# Patient Record
Sex: Female | Born: 1965 | Race: White | Hispanic: No | Marital: Married
Health system: Midwestern US, Community
[De-identification: ages and names within clinical notes are randomized; demographics above are authoritative.]

## PROBLEM LIST (undated history)

## (undated) DIAGNOSIS — Z1231 Encounter for screening mammogram for malignant neoplasm of breast: Principal | ICD-10-CM

## (undated) DIAGNOSIS — M51369 Other intervertebral disc degeneration, lumbar region without mention of lumbar back pain or lower extremity pain: Secondary | ICD-10-CM

## (undated) DIAGNOSIS — M5459 Other low back pain: Principal | ICD-10-CM

## (undated) DIAGNOSIS — M5136 Other intervertebral disc degeneration, lumbar region: Secondary | ICD-10-CM

## (undated) DIAGNOSIS — M7542 Impingement syndrome of left shoulder: Secondary | ICD-10-CM

## (undated) DIAGNOSIS — M545 Low back pain, unspecified: Secondary | ICD-10-CM

## (undated) DIAGNOSIS — E785 Hyperlipidemia, unspecified: Secondary | ICD-10-CM

## (undated) DIAGNOSIS — E663 Overweight: Secondary | ICD-10-CM

## (undated) DIAGNOSIS — J302 Other seasonal allergic rhinitis: Secondary | ICD-10-CM

## (undated) DIAGNOSIS — R12 Heartburn: Secondary | ICD-10-CM

## (undated) DIAGNOSIS — Z87442 Personal history of urinary calculi: Secondary | ICD-10-CM

## (undated) DIAGNOSIS — K219 Gastro-esophageal reflux disease without esophagitis: Secondary | ICD-10-CM

## (undated) DIAGNOSIS — R51 Headache: Secondary | ICD-10-CM

## (undated) DIAGNOSIS — E119 Type 2 diabetes mellitus without complications: Secondary | ICD-10-CM

## (undated) HISTORY — DX: Overweight: E66.3

## (undated) HISTORY — DX: Heartburn: R12

## (undated) HISTORY — DX: Type 2 diabetes mellitus without complications: E11.9

## (undated) HISTORY — PX: WISDOM TOOTH EXTRACTION: SHX21

## (undated) HISTORY — PX: BREAST SURGERY: SHX581

---
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
## 1998-06-11 HISTORY — PX: OTHER SURGICAL HISTORY: SHX169

## 1999-04-24 ENCOUNTER — Other Ambulatory Visit: Admission: RE | Admit: 1999-04-24 | Discharge: 1999-04-24 | Payer: Self-pay | Admitting: Gynecology

## 2000-04-29 ENCOUNTER — Other Ambulatory Visit: Admission: RE | Admit: 2000-04-29 | Discharge: 2000-04-29 | Payer: Self-pay | Admitting: Obstetrics and Gynecology

## 2001-08-10 ENCOUNTER — Encounter: Payer: Self-pay | Admitting: Emergency Medicine

## 2001-08-10 ENCOUNTER — Emergency Department (HOSPITAL_COMMUNITY): Admission: EM | Admit: 2001-08-10 | Discharge: 2001-08-10 | Payer: Self-pay | Admitting: Emergency Medicine

## 2002-03-09 ENCOUNTER — Emergency Department (HOSPITAL_COMMUNITY): Admission: EM | Admit: 2002-03-09 | Discharge: 2002-03-09 | Payer: Self-pay | Admitting: Emergency Medicine

## 2002-10-12 ENCOUNTER — Ambulatory Visit (HOSPITAL_COMMUNITY): Admission: RE | Admit: 2002-10-12 | Discharge: 2002-10-12 | Payer: Self-pay | Admitting: Oral and Maxillofacial Surgery

## 2002-10-12 ENCOUNTER — Encounter: Payer: Self-pay | Admitting: Oral and Maxillofacial Surgery

## 2003-02-26 ENCOUNTER — Other Ambulatory Visit: Admission: RE | Admit: 2003-02-26 | Discharge: 2003-02-26 | Payer: Self-pay | Admitting: Gynecology

## 2004-09-03 ENCOUNTER — Emergency Department (HOSPITAL_COMMUNITY): Admission: EM | Admit: 2004-09-03 | Discharge: 2004-09-03 | Payer: Self-pay | Admitting: Family Medicine

## 2004-12-27 ENCOUNTER — Other Ambulatory Visit: Admission: RE | Admit: 2004-12-27 | Discharge: 2004-12-27 | Payer: Self-pay | Admitting: Gynecology

## 2006-01-02 ENCOUNTER — Other Ambulatory Visit: Admission: RE | Admit: 2006-01-02 | Discharge: 2006-01-02 | Payer: Self-pay | Admitting: Gynecology

## 2006-01-17 ENCOUNTER — Ambulatory Visit: Payer: Self-pay | Admitting: Internal Medicine

## 2006-02-15 ENCOUNTER — Ambulatory Visit: Payer: Self-pay | Admitting: Internal Medicine

## 2006-03-04 ENCOUNTER — Ambulatory Visit: Payer: Self-pay | Admitting: Internal Medicine

## 2006-04-01 ENCOUNTER — Ambulatory Visit (HOSPITAL_COMMUNITY): Admission: RE | Admit: 2006-04-01 | Discharge: 2006-04-01 | Payer: Self-pay | Admitting: Gynecology

## 2006-04-01 ENCOUNTER — Encounter (INDEPENDENT_AMBULATORY_CARE_PROVIDER_SITE_OTHER): Payer: Self-pay | Admitting: Specialist

## 2007-01-31 ENCOUNTER — Other Ambulatory Visit: Admission: RE | Admit: 2007-01-31 | Discharge: 2007-01-31 | Payer: Self-pay | Admitting: Gynecology

## 2008-02-02 ENCOUNTER — Other Ambulatory Visit: Admission: RE | Admit: 2008-02-02 | Discharge: 2008-02-02 | Payer: Self-pay | Admitting: Gynecology

## 2008-02-19 ENCOUNTER — Encounter: Admission: RE | Admit: 2008-02-19 | Discharge: 2008-02-19 | Payer: Self-pay | Admitting: Internal Medicine

## 2008-03-19 ENCOUNTER — Emergency Department (HOSPITAL_COMMUNITY): Admission: EM | Admit: 2008-03-19 | Discharge: 2008-03-19 | Payer: Self-pay | Admitting: Emergency Medicine

## 2009-01-12 ENCOUNTER — Ambulatory Visit: Payer: Self-pay | Admitting: Women's Health

## 2009-01-14 ENCOUNTER — Telehealth: Payer: Self-pay | Admitting: Internal Medicine

## 2009-01-19 ENCOUNTER — Ambulatory Visit: Payer: Self-pay | Admitting: Gastroenterology

## 2009-01-19 DIAGNOSIS — R109 Unspecified abdominal pain: Secondary | ICD-10-CM | POA: Insufficient documentation

## 2009-01-19 DIAGNOSIS — R195 Other fecal abnormalities: Secondary | ICD-10-CM | POA: Insufficient documentation

## 2009-01-19 DIAGNOSIS — K589 Irritable bowel syndrome without diarrhea: Secondary | ICD-10-CM | POA: Insufficient documentation

## 2009-01-19 DIAGNOSIS — R159 Full incontinence of feces: Secondary | ICD-10-CM | POA: Insufficient documentation

## 2009-01-19 DIAGNOSIS — K219 Gastro-esophageal reflux disease without esophagitis: Secondary | ICD-10-CM | POA: Insufficient documentation

## 2009-01-20 LAB — CONVERTED CEMR LAB
Bilirubin Urine: NEGATIVE
Hemoglobin, Urine: NEGATIVE
Ketones, ur: NEGATIVE mg/dL
Leukocytes, UA: NEGATIVE
Nitrite: NEGATIVE
Specific Gravity, Urine: 1.02 (ref 1.000–1.030)
Total Protein, Urine: NEGATIVE mg/dL
Urine Glucose: NEGATIVE mg/dL
Urobilinogen, UA: 0.2 (ref 0.0–1.0)
pH: 6 (ref 5.0–8.0)

## 2009-02-02 ENCOUNTER — Ambulatory Visit: Payer: Self-pay | Admitting: Women's Health

## 2009-02-02 ENCOUNTER — Other Ambulatory Visit: Admission: RE | Admit: 2009-02-02 | Discharge: 2009-02-02 | Payer: Self-pay | Admitting: Gynecology

## 2009-02-02 ENCOUNTER — Encounter: Payer: Self-pay | Admitting: Women's Health

## 2009-02-18 ENCOUNTER — Ambulatory Visit: Payer: Self-pay | Admitting: Internal Medicine

## 2009-09-28 ENCOUNTER — Emergency Department (HOSPITAL_COMMUNITY): Admission: EM | Admit: 2009-09-28 | Discharge: 2009-09-28 | Payer: Self-pay | Admitting: Emergency Medicine

## 2010-01-06 ENCOUNTER — Encounter: Admission: RE | Admit: 2010-01-06 | Discharge: 2010-01-06 | Payer: Self-pay | Admitting: Orthopedic Surgery

## 2010-01-07 ENCOUNTER — Emergency Department (HOSPITAL_COMMUNITY): Admission: EM | Admit: 2010-01-07 | Discharge: 2010-01-08 | Payer: Self-pay | Admitting: Emergency Medicine

## 2010-02-03 ENCOUNTER — Ambulatory Visit: Payer: Self-pay | Admitting: Women's Health

## 2010-02-03 ENCOUNTER — Other Ambulatory Visit: Admission: RE | Admit: 2010-02-03 | Discharge: 2010-02-03 | Payer: Self-pay | Admitting: Gynecology

## 2010-08-26 LAB — POCT I-STAT, CHEM 8
BUN: 17 mg/dL (ref 6–23)
Calcium, Ion: 1.09 mmol/L — ABNORMAL LOW (ref 1.12–1.32)
Chloride: 109 mEq/L (ref 96–112)
Glucose, Bld: 113 mg/dL — ABNORMAL HIGH (ref 70–99)
HCT: 41 % (ref 36.0–46.0)
Potassium: 3.9 mEq/L (ref 3.5–5.1)

## 2010-08-26 LAB — URINALYSIS, ROUTINE W REFLEX MICROSCOPIC
Leukocytes, UA: NEGATIVE
Nitrite: NEGATIVE
Specific Gravity, Urine: 1.013 (ref 1.005–1.030)
pH: 6.5 (ref 5.0–8.0)

## 2010-08-26 LAB — DIFFERENTIAL
Basophils Absolute: 0.2 10*3/uL — ABNORMAL HIGH (ref 0.0–0.1)
Basophils Relative: 2 % — ABNORMAL HIGH (ref 0–1)
Eosinophils Relative: 0 % (ref 0–5)
Lymphocytes Relative: 30 % (ref 12–46)

## 2010-08-26 LAB — CBC
MCHC: 34.7 g/dL (ref 30.0–36.0)
MCV: 91.6 fL (ref 78.0–100.0)
Platelets: 296 10*3/uL (ref 150–400)
RDW: 14.1 % (ref 11.5–15.5)
WBC: 13.6 10*3/uL — ABNORMAL HIGH (ref 4.0–10.5)

## 2010-08-26 LAB — URINE MICROSCOPIC-ADD ON

## 2010-10-27 NOTE — Op Note (Signed)
Angela Franklin, Angela Franklin                ACCOUNT NO.:  [id number]   MEDICAL RECORD NO.:  [id number]          PATIENT TYPE:  AMB   LOCATION:  SDC                           FACILITY:  [facility]   PHYSICIAN:  Juan H. Lily Peer, M.D.DATE OF BIRTH:  05-01-1966   DATE OF PROCEDURE:  04/01/2006  DATE OF DISCHARGE:                                 OPERATIVE REPORT   SURGEON:  Juan H. Lily Peer, M.D.   INDICATIONS FOR OPERATION:  The patient is a 45 year old gravida 0 with  history of menometrorrhagia with workup consisting of benign endometrial  biopsy, normal TSH and prolactin. Patient scheduled to undergo diagnostic  hysteroscopy and dilation and evacuation and endometrial ablation via  NovaSure technique.   PREOPERATIVE DIAGNOSIS:  Menometrorrhagia.   POSTOPERATIVE DIAGNOSIS:  Menometrorrhagia.   ANESTHESIA:  General endotracheal anesthesia.   PROCEDURES PERFORMED:  1. Diagnostic hysteroscopy.  2. Attempted endometrial ablation NovaSure technique.  3. D&C.   FINDINGS:  A retroverted uterus with no palpable adnexal masses.  Small  uterus in size.  Hysteroscopic evaluation.  Smooth endocervical canal, lush  endometrium, very narrow uterine cavity, tubal ostia not visualized.   DESCRIPTION OF OPERATION:  After the patient adequately counseled, she is  taken to the operating room where she underwent successful general  endotracheal anesthesia.  She received a gram of cefoxitin IV prophylaxis.  The vagina and perineum were prepped and draped in usual sterile fashion.  Red rubber Roxan Hockey had been inserted into the bladder to evacuate its  contents for approximately 100 mL.  The cervix was then grasped with a  single-tooth tenaculum at the anterior cervical lip.  Endocervical  measurement was obtained and was subtracted from the depth of endometrial  cavity total depth of 5 cm.  The cervix was then serially dilated to a 23 mm  in effort to allow the NovaSure endometrial ablation RF device to be  inserted.  It was introduced into the intrauterine cavity and when the  apparatus was deployed to expand the array, it did not expand greater than  2.5 cm which was a contraindication to proceed with this technique for  endometrial ablation.  This was attempted three times without the RF  frequency initiated and the endometrial ablation portion was aborted for  safety concerns.  Hysteroscopically normal saline had been used as the  distending media and the uterine cavity was inspected, once again noting  that the walls were narrow and the tubal ostia were not seen and the  endometrium was very lush.  A vigorous blunt curettage followed by suction  curettage was utilized to completely evacuate the cavity and the tissue  submitted histological evaluation.  The single-tooth tenaculum was removed.  Fluid deficit was 40 mL.  The patient was extubated, transferred to recovery  room with stable vital signs.  Blood loss was minimal.  Fluid resuscitation  consisted of 1100 mL of lactated  Ringer's.  She received 30 mg Toradol IV in route to the recovery room.  She  will receive Megace 20 mg b.i.d. for the next 10 days upon discharge, and  she has also  been given a prescription of Darvocet and Reglan to take as  needed.      Juan H. Lily Peer, M.D.  Electronically Signed     JHF/MEDQ  D:  04/01/2006  T:  04/01/2006  Job:  [id number]

## 2010-10-27 NOTE — Assessment & Plan Note (Signed)
[facility]                           GASTROENTEROLOGY OFFICE NOTE   NAME:Angela Franklin, Angela Franklin                       MRN:          [id number]  DATE:01/17/2006                            DOB:          Sep 18, 1965    REASON FOR CONSULTATION:  Diarrhea and dyspepsia.   HISTORY:  This is a 45 year old white female with dyslipidemia who is  referred through the courtesy of Dr. Eloise Harman regarding problems with  altered bowel habits and dyspepsia.  Patient reports a 20-year history of  postprandial urgency with loose stools occurring once or twice per month.  For this she would take Imodium with some improvement.  In between episodes  she would experience constipation, possibly due to the antidiarrheal agent.  However, over the past six weeks she has had significant problems with  postprandial diarrhea and cramping as well as urgency.  Again she has used  Imodium, though side effects have included cramping, bloating and  constipation.  She feels that her symptoms are worse during her menstrual  cycle.  In recent weeks, she is somewhat better, though modestly.  She  denies fever, bleeding or vomiting.  She has had somewhat of a decreased  appetite over the past six to eight weeks and reports a 6 pound weight loss.  Over the past five years though, her weight has been stable.  She reports a  greater than 10-year history of problems with indigestion and heartburn for  which she has taken Prilosec.  She also recently reported problems with  nausea and regurgitation spells for which she was placed on Reglan.  No  dysphagia.   PAST MEDICAL HISTORY:  Dyslipidemia.   PAST SURGICAL HISTORY:  1. Left breast surgery for fibrocystic disease.  2. Spinal fusion in 2000.  3. Temporomandibular joint surgery.  4. Right foot surgery.   ALLERGIES:  CODEINE.   CURRENT MEDICATIONS:  1. Prilosec 20 mg daily.  2. Tricor unspecified dosage daily.  3. Metoclopramide  unspecified dosage at night.  4. Omega-3.  5. Multivitamin.  6. Jasmine.  7. She also uses Darvocet p.r.n.  8. Rutacil p.r.n.  9. Limitrol p.r.n.   FAMILY HISTORY:  No family history of gastrointestinal malignancy or  inflammatory bowel disease.  No family history of sprue.  Family history of  diabetes and heart disease, however.   SOCIAL HISTORY:  Patient is married without children.  She lives with her  husband.  She has a Event organiser.  Attended both [employer] and  [employer].  She works as an Airline pilot for [employer].  She does not smoke.  She has approximately 2 alcoholic beverages per month.   PHYSICAL EXAMINATION:  GENERAL:  Well-appearing female in no acute distress.  VITAL SIGNS:  Blood pressure 110/68, heart rate is 100 and regular.  Weight  is 172.6 pounds.  She is 5 feet 4 inches in height.  HEENT:  Sclerae anicteric.  Conjunctivae are pink.  Oral mucosa intact. No  adenopathy.  LUNGS:  Clear.  HEART:  Regular.  ABDOMEN:  Soft without tenderness, mass or hernia.  Good bowel sounds heard.  EXTREMITIES:  Without edema.   IMPRESSION:  1. Chronic problems with postprandial urgency and loose stools intermixed      with episodes of constipation.  Symptoms most compatible with irritable      bowel.  However, significant worsening of symptoms over the past six to      eight weeks with predominance of diarrhea, cramping and urgency.  Also      some mild weight loss.  Rule out post infectious exacerbation of      irritable bowel syndrome, rule out infectious diarrhea, rule out occult      celiac disease, rule out occult inflammatory bowel disease.  2. Chronic gastroesophageal reflux disease.  Recent problems with      regurgitation and choking episodes, though no true dysphagia.      Currently on a proton pump inhibitor and promotility agent.   RECOMMENDATIONS:  1. Screening laboratories.  2. Stool studies to exclude an infection.  3. Sprue panel.  4. Schedule  a colonoscopy to evaluate lower gastrointestinal complaints      and upper endoscopy to evaluate chronic reflux symptoms and rule out      Barrett's esophagus.  The nature of both procedures as well as the      risks, benefits and alternatives have been reviewed.  She understood      and agreed to proceed.                                   Wilhemina Bonito. Eda Keys., MD   JNP/MedQ  DD:  01/19/2006  DT:  01/20/2006  Job #:  [id number]   cc:   Barry Dienes. Eloise Harman, MD

## 2010-10-27 NOTE — H&P (Signed)
NAMECATERA, HANKINS                ACCOUNT NO.:  [id number]   MEDICAL RECORD NO.:  [id number]          PATIENT TYPE:  AMB   LOCATION:  SDC                           FACILITY:  [facility]   PHYSICIAN:  Juan H. Lily Peer, M.D.DATE OF BIRTH:  1965/11/21   DATE OF ADMISSION:  04/01/2006  DATE OF DISCHARGE:                                HISTORY & PHYSICAL   CHIEF COMPLAINT:  Menometrorrhagia.   HISTORY:  The patient is a 45 year old gravida 0 whose husband has had a  vasectomy.  The patient has had menometrorrhagia, has had evaluation  consisting of an endometrial biopsy which had demonstrated benign  endometrial polyp.  She also had a functional cyst which showed on followup  ultrasound complete resolution of this left ovarian cyst.  Her Pap smear had  been normal.  Her TSH and prolactin were normal and she was counseled for an  outpatient endometrial ablation.  Literature information had been provided  and the patient is no longer interested in having a children so it is not an  issue and her husband has had a vasectomy.   PAST MEDICAL HISTORY:  She is allergic to CODEINE.  She takes Prilosec for  GERD, and multivitamins.  She was also recently put on TriCor by her family  physician secondary to hyperlipidemia.  She has a history of TMJ.   FAMILY HISTORY:  Paternal grandparents and sister with diabetes as well as a  grandfather with history of cardiovascular and her father with hypertension,  and an uncle with MI in his 58s.  The patient stated that her husband has  had a vasectomy.   PHYSICAL EXAMINATION:  VITAL SIGNS:  The patient weighs 173 pounds, 5 feet 4  and three-quarter inches tall.  Blood pressure 122/82.  HEENT:  Unremarkable.  NECK:  Supple, trachea midline.  No carotid bruits, no thyromegaly.  LUNGS:  Clear to auscultation without rhonchi or wheezes.  HEART:  Regular rate and rhythm, no murmurs or gallops.  BREASTS:  Exam not done.  ABDOMEN:  Soft, nontender.  No rebound or  guarding.  PELVIC:  Bartholin's, urethra, Skene glands within normal limits.  Vagina  and cervix, no lesions or discharge.  Uterus slightly retroverted, normal  size, shape and consistency.  Adnexa without any masses or tenderness.  RECTAL:  Exam not done.   ASSESSMENT:  A 45 year old gravida 1 para 0 with menometrorrhagia with  endometrial biopsy demonstrating a benign endometrial polyp with no evidence  of hyperplasia.  The patient would like to proceed with an outpatient  endometrial ablation.  The risks, benefits, and pros and cons were discussed  and literature information had previously been provided.  The patient no  longer interested in having children and her husband has had a vasectomy.  All questions are answered and will follow accordingly.   PLAN:  The patient scheduled for NovaSure endometrial ablation Monday,  April 01, 2006, at 7:30 a.m. at [facility].      Stevinson H. Lily Peer, M.D.  Electronically Signed     JHF/MEDQ  D:  03/29/2006  T:  03/29/2006  Job:  [id number]

## 2010-10-27 NOTE — Assessment & Plan Note (Signed)
[facility]                           GASTROENTEROLOGY OFFICE NOTE   NAME:Angela Franklin, Angela Franklin                       MRN:          [id number]  DATE:03/04/2006                            DOB:          [birth year]    HISTORY:  The patient presents today for follow up. She is a 45 year old  evaluated January 17, 2006 for diarrhea and dyspepsia. See that dictation for  details. She underwent screening laboratories including comprehensive  metabolic panel, erythrocyte sedimentation rate, and CBC. These were  unremarkable. Multiple stool studies were also negative. She did have some  moderate white blood cells in the stool, however. Testing for sprue was  negative. Colonoscopy and upper endoscopy were performed February 15, 2006.  Complete colonoscopy including intubation of the terminal ileum was normal.  Random biopsies of the colon were taken and returned normal. No evidence of  microscopic colitis. Upper endoscopy revealed multiple benign appearing  gastric polyps which were biopsied and confirmed to be benign fundic gland  polyps. She also has small sliding hiatal hernia. No Barrett's esophagus or  active inflammation. For reflux, she was continued on Prilosec and  metoclopramide. For her lower GI complaints, Levbid prescribed. She has not  needed Levbid. Since her procedures, she reports doing better. The reflux  symptoms are under good control. No further problems with significant  diarrhea or urgency.   CURRENT MEDICATIONS:  Prilosec, TriCor, metoclopramide, omega 3,  multivitamin, Rhinocort.   PHYSICAL EXAMINATION:  GENERAL:  Well appearing female in no acute distress.  VITAL SIGNS:  Blood pressure 126/58, heart rate 78, weight 176 pounds.  ABDOMEN:  Entirely benign.   IMPRESSION:  1. Gastroesophageal reflux disease. Symptoms controlled with Prilosec and      metoclopramide.  2. Irritable bowel syndrome. Recent problems most likely post infectious    exacerbation of irritable bowel syndrome. Now improved.   RECOMMENDATIONS:  1. Continue current medical regimen for reflux.  2. Reflux precautions with attention to weight loss as well as elevation      of the head of the bed.  3. Use Levbid p.r.n. for recurrent lower GI symptoms.  4. Return to the general medical care of Dr. Jarome Matin.                                  Wilhemina Bonito. Eda Keys., MD   JNP/MedQ  DD:  03/04/2006  DT:  03/06/2006  Job #:  [id number]   cc:   Barry Dienes. Eloise Harman, M.D.

## 2011-01-11 ENCOUNTER — Encounter: Payer: Self-pay | Admitting: Podiatry

## 2011-04-12 HISTORY — PX: OTHER SURGICAL HISTORY: SHX169

## 2011-06-30 IMAGING — CT CT ABD-PELV W/O CM
2 of 4 series · 17 of 46 positions shown, 19 images · non-contrast
Comparison: None.

CLINICAL DATA: Left-sided flank pain, nausea

CT ABDOMEN AND PELVIS WITHOUT CONTRAST
TECHNIQUE: Multidetector CT imaging of the abdomen and pelvis was
performed following the standard protocol without intravenous
contrast.

[Series 2: rtn ap without · axial · non-contrast · 0.64mm/px · z∈[-512,-78]mm · 14 of 95 slices shown, 16 images]
[im 4/95  soft-tissue]
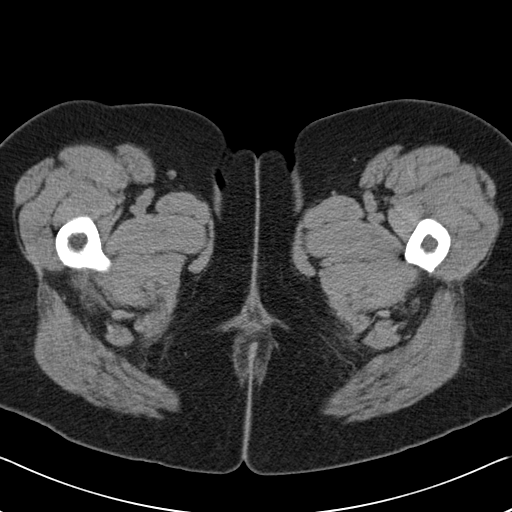
[im 4/95  bone]
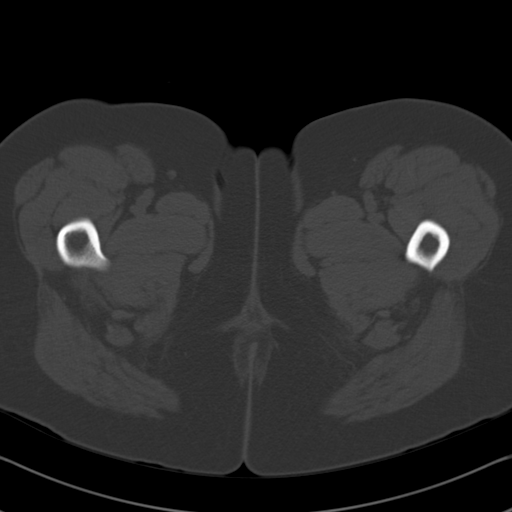
[im 12/95  soft-tissue]
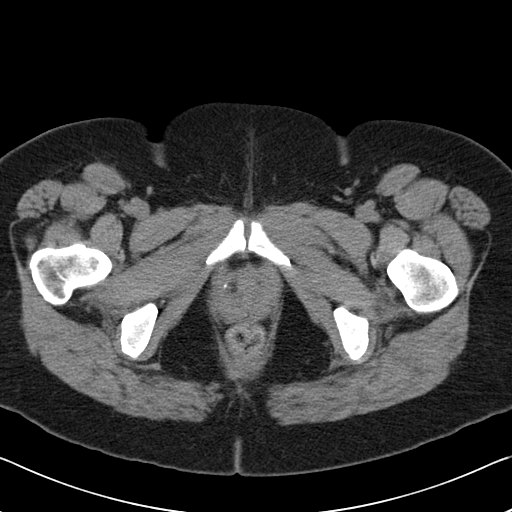
[im 19/95  soft-tissue]
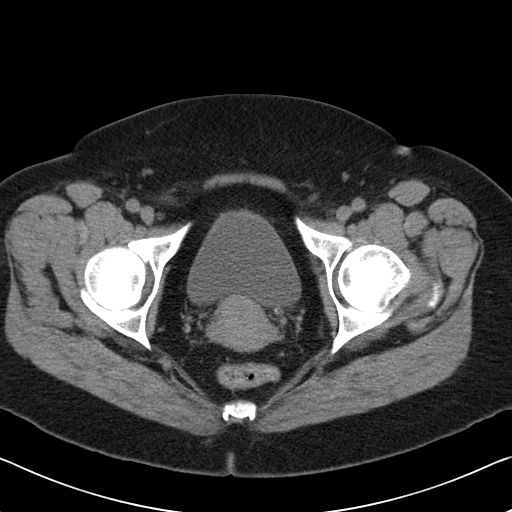
[im 27/95  soft-tissue]
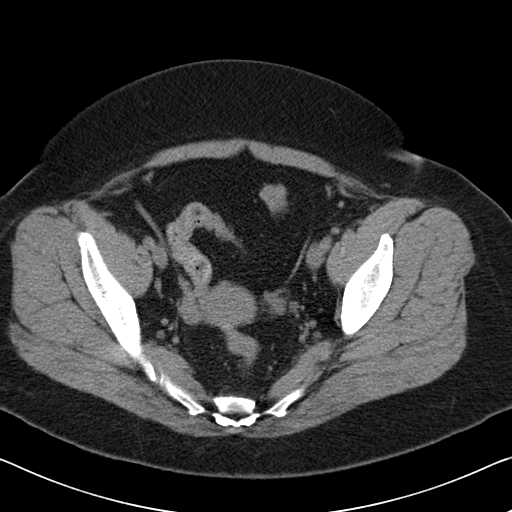
[im 31/95  soft-tissue]
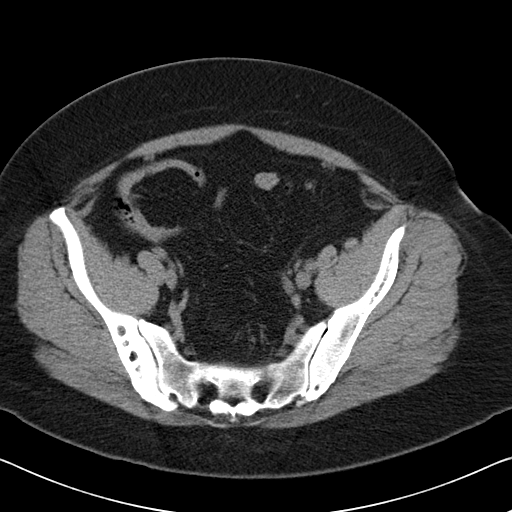
[im 38/95  soft-tissue]
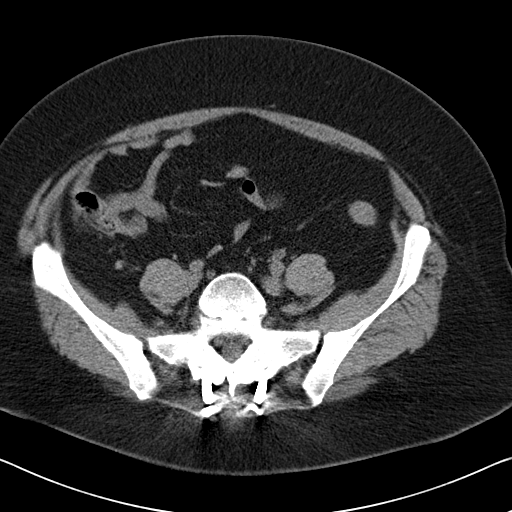
[im 46/95  soft-tissue]
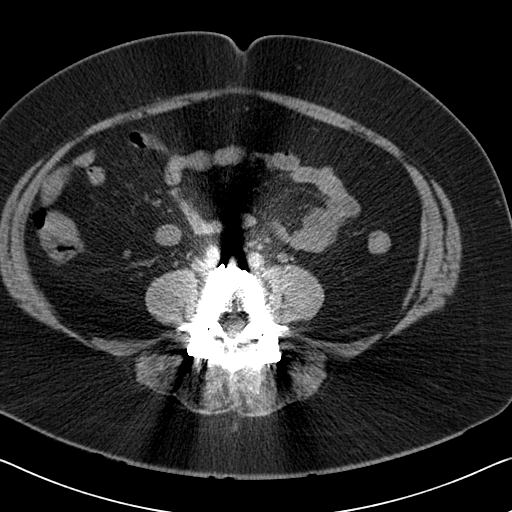
[im 49/95  soft-tissue]
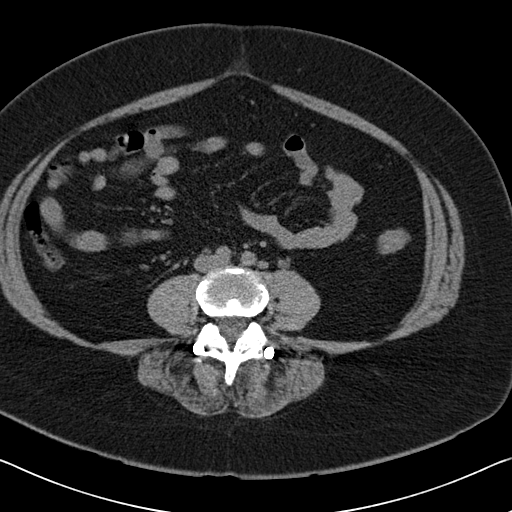
[im 57/95  soft-tissue]
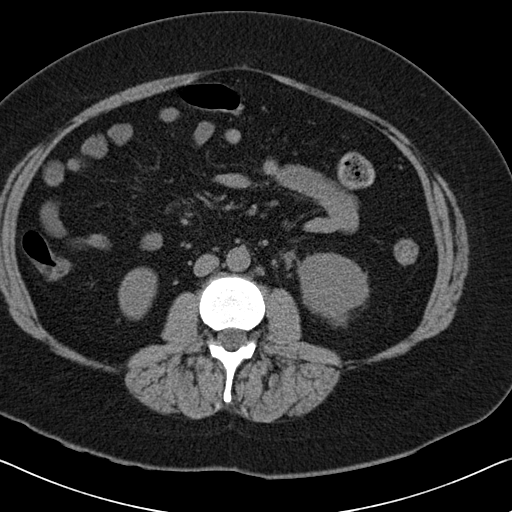
[im 57/95  bone]
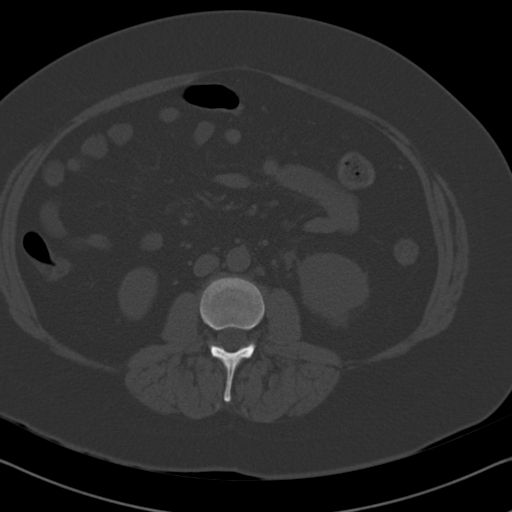
[im 64/95  soft-tissue]
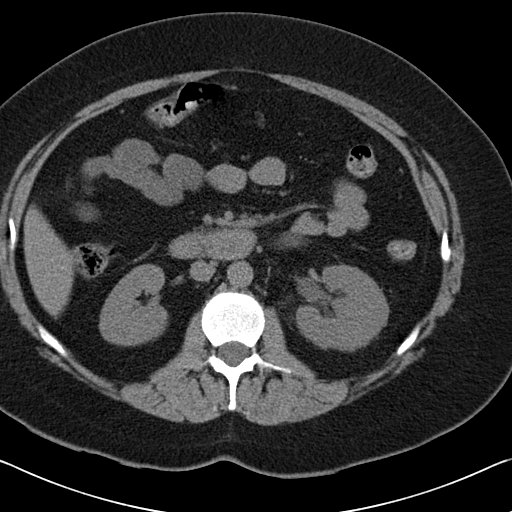
[im 72/95  soft-tissue]
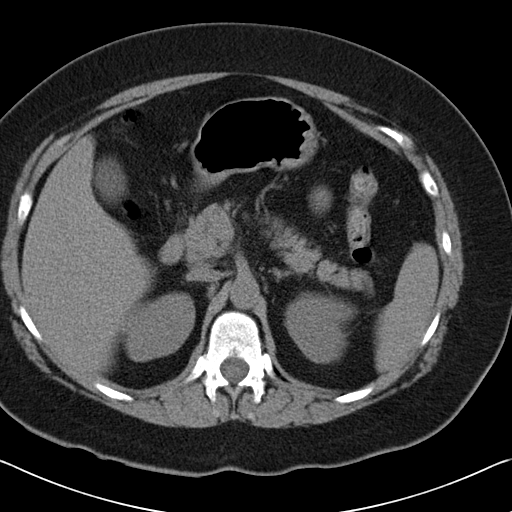
[im 76/95  soft-tissue]
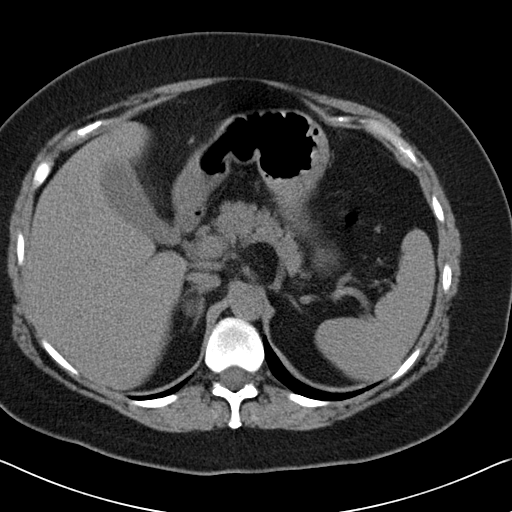
[im 83/95  soft-tissue]
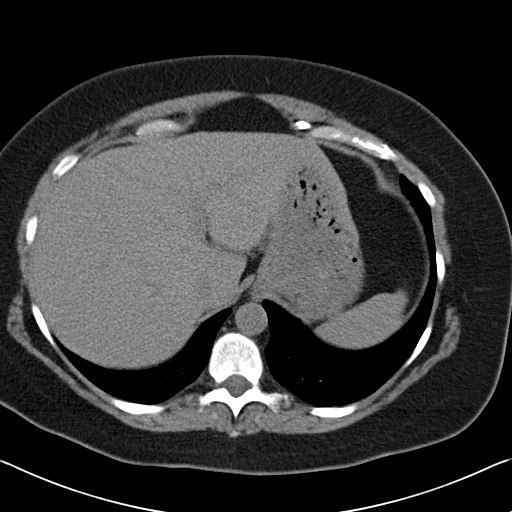
[im 91/95  soft-tissue]
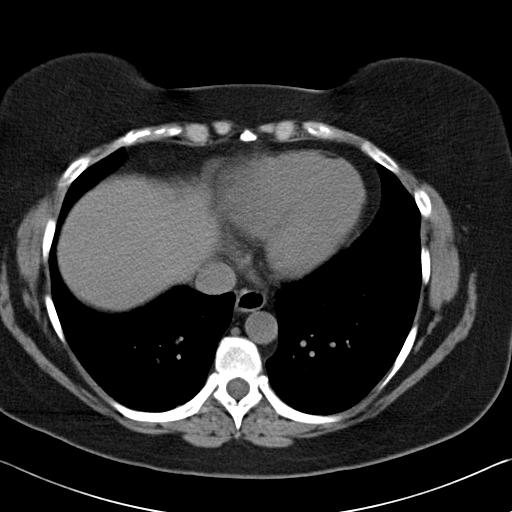

[Series 602: coronals · coronal · 0.96mm/px · 3 of 88 slices shown]
[im 30/88  soft-tissue]
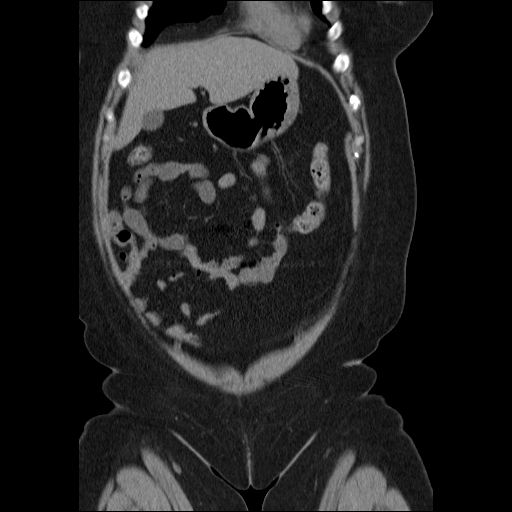
[im 39/88  soft-tissue]
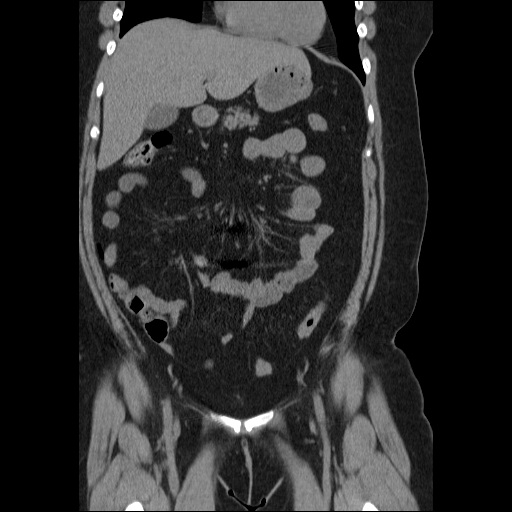
[im 49/88  soft-tissue]
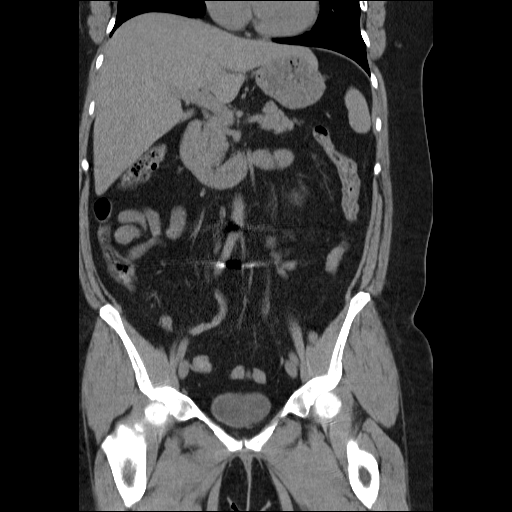

[17 of 46 positions shown; findings below may reference images not displayed]

FINDINGS: The lung bases are clear.  The liver is unremarkable in
the unenhanced state.  No calcified gallstones are seen.  The
pancreas is normal in size and the pancreatic duct is not dilated.
The adrenal glands are unremarkable with right adrenal adenoma
present of 18 mm in diameter.  The spleen is normal in size.
Small left renal calculi are present and there is a mild left
hydronephrosis present.  No right renal calculi are seen.  The
ureter remains dilated to a point of partial obstruction by a 2 mm
distal right ureteral calculus at the right UV junction.  The
uterus is normal in size.  The urinary bladder is unremarkable.  No
free fluid is seen within the pelvis.  The appendix and terminal
ileum appear normal.  Hardware for posterior fusion from L3- S1 is
noted with normal alignment.
IMPRESSION: 1.  Low grade obstruction of the left renal collecting system by a
2 mm distal left ureteral calculus at the left UV junction.
2.  Small left renal calculi.
3.  The appendix appears normal.

## 2012-01-11 ENCOUNTER — Other Ambulatory Visit: Payer: Self-pay | Admitting: Family Medicine

## 2012-01-11 ENCOUNTER — Other Ambulatory Visit (HOSPITAL_COMMUNITY)
Admission: RE | Admit: 2012-01-11 | Discharge: 2012-01-11 | Disposition: A | Discharge status: Home / Self Care | Payer: 59 | Source: Ambulatory Visit | Attending: Family Medicine | Admitting: Family Medicine

## 2012-01-11 DIAGNOSIS — Z124 Encounter for screening for malignant neoplasm of cervix: Secondary | ICD-10-CM | POA: Insufficient documentation

## 2012-01-11 DIAGNOSIS — Z1151 Encounter for screening for human papillomavirus (HPV): Secondary | ICD-10-CM | POA: Insufficient documentation

## 2012-05-15 ENCOUNTER — Other Ambulatory Visit: Payer: Self-pay | Admitting: Family Medicine

## 2012-05-15 DIAGNOSIS — N939 Abnormal uterine and vaginal bleeding, unspecified: Secondary | ICD-10-CM

## 2012-05-20 ENCOUNTER — Other Ambulatory Visit: Payer: 59

## 2012-05-27 ENCOUNTER — Ambulatory Visit
Admission: RE | Admit: 2012-05-27 | Discharge: 2012-05-27 | Disposition: A | Discharge status: Home / Self Care | Payer: 59 | Source: Ambulatory Visit | Attending: Family Medicine | Admitting: Family Medicine

## 2012-05-27 DIAGNOSIS — N939 Abnormal uterine and vaginal bleeding, unspecified: Secondary | ICD-10-CM

## 2013-03-19 ENCOUNTER — Ambulatory Visit: Payer: Self-pay | Admitting: Podiatry

## 2013-04-09 ENCOUNTER — Ambulatory Visit: Payer: Self-pay | Admitting: Podiatry

## 2013-05-04 ENCOUNTER — Encounter (HOSPITAL_COMMUNITY): Payer: Self-pay | Admitting: Pharmacist

## 2013-05-11 ENCOUNTER — Encounter (HOSPITAL_COMMUNITY)
Admission: RE | Admit: 2013-05-11 | Discharge: 2013-05-11 | Disposition: A | Discharge status: Home / Self Care | Payer: 59 | Source: Ambulatory Visit | Attending: Obstetrics and Gynecology | Admitting: Obstetrics and Gynecology

## 2013-05-11 ENCOUNTER — Encounter (HOSPITAL_COMMUNITY): Payer: Self-pay

## 2013-05-11 HISTORY — DX: Headache: R51

## 2013-05-11 HISTORY — DX: Personal history of urinary calculi: Z87.442

## 2013-05-11 HISTORY — DX: Hyperlipidemia, unspecified: E78.5

## 2013-05-11 HISTORY — DX: Other seasonal allergic rhinitis: J30.2

## 2013-05-11 HISTORY — DX: Gastro-esophageal reflux disease without esophagitis: K21.9

## 2013-05-11 LAB — TYPE AND SCREEN: ABO/RH(D): O POS

## 2013-05-11 LAB — CBC
HCT: 40.8 % (ref 36.0–46.0)
MCV: 89.9 fL (ref 78.0–100.0)
Platelets: 301 10*3/uL (ref 150–400)
RBC: 4.54 MIL/uL (ref 3.87–5.11)
RDW: 13.2 % (ref 11.5–15.5)
WBC: 9 10*3/uL (ref 4.0–10.5)

## 2013-05-11 NOTE — Patient Instructions (Addendum)
   Your procedure is scheduled on: Thursday, Dec 4  Enter through the [employer] [facility] at: 10 AM Pick up the phone at the desk and dial [phone] and inform us of your arrival.  Please call this number if you have any problems the morning of surgery: [phone]  Remember: Do not eat or drink after midnight:  Wednesday Take these medicines the morning of surgery with a SIP OF WATER:  prilosec  Do not wear jewelry, make-up, or FINGER nail polish No metal in your hair or on your body. Do not wear lotions, powders, perfumes. You may wear deodorant.  Please use your CHG wash as directed prior to surgery.  Do not shave anywhere for at least 12 hours prior to first CHG shower.  Do not bring valuables to the hospital. Contacts, dentures or bridgework may not be worn into surgery.  Leave suitcase in the car. After Surgery it may be brought to your room. For patients being admitted to the hospital, checkout time is 11:00am the day of discharge.  Home with husband Aurther Loft.

## 2013-05-11 NOTE — H&P (Signed)
History of Present Illness  General:  No bleeding since last visit. Still on Portia. Has had bleeding episodes. RLQ is constant burning RLQ pain. The pain worsens right before the bleeding starts.   Current Medications  Taking   Imipramine HCl 25 MG Tablet 2 tablets nightly   Vitamin B12 2500 MCG Tablet 1 tablet once a day   Prilosec 20 MG Capsule Delayed Release 1 capsule Once a day   Xyzal 5 MG Tablet 1 tablet nightly   Align Capsule 1 tablet once a day   Magnesium 300 MG Capsule 1 capsule with a meal Once a day   Portia-28 0.15-0.03 MG Tablet take 1 tablet each day. Once a day. Skip placebo week.   Pravastatin Sodium 20 MG Tablet 1 tablet Once a day   Not-Taking/PRN   Metoclopramide HCl 10 MG Tablet 1 tablet as needed   Maxalt 10 MG Tablet as directed as needed   allergy shots sq every other week   Flonase 50 MCG/ACT Suspension 2 sprays as needed   Cambia 50 MG powder powder dissolved in 2 oz of H2O as needed   Baclofen 10 MG Tablet 1 tablet with food or milk as needed, Notes: Rare   Medication List reviewed and reconciled with the patient    Past Medical History  migraine headaches - Dr [facility]  allergies - Dr Westphalia Callas  Heartburn  Overweight  Hyperlipidemia, hypertriglyceridemia  Post infectious reactive airways disease  Dr Dion Body (GYN)  Podiatry - [facility] -- plantar fascitis  Ortho (spine) - Dr Berneice Gandy   Dr Donato Heinz - Oral Surgeon  History of nephrolithiasis x 1  very low normal vitamin B12 - likely deficient  Ortho- Dr Rose[facility]   Surgical History  spinal fusion L3-4-5? 2000  TMJ right disc replacement 1982  R ankle surgery to repair break 1976  Right foot surgery - plantar facitis and hammer toe 04/2011   Family History  Father: alive 46 yrs, HTN, diabetes, diagnosed with DM, HTN  Mother: alive 7 yrs, high cholesterol, pericarditis  Brother 1: alive 77 yrs, A + W  Sister 1: alive 45 yrs, migraines  denies any GYN  family cancer hx.   Social History  General:  Tobacco use  cigarettes: Never smoked Tobacco history last updated 02/24/2013 no Smoking.  Alcohol: yes, occasionally, 1-2 per week.  Caffeine: yes, soda, 2+ servings daily.  no Recreational drug use.  Exercise: yes, 5 a x weekly, walks.  Occupation: employed, Airline pilot at [employer].  Education: Masters Mudlogger, Business. Undergrad - Math.  Marital Status: married.  Children: none.    Gyn History  Sexual activity currently sexually active.  Periods : irregular.  LMP 03/17/13, lasted 2 weeks.  Birth control portia.  Last pap smear date 01/2012, all neg, HPV-.  Last mammogram date 12/20/2011.  Abnormal pap smear No.  Denies H/O STD.    OB History  Never been pregnant per patient.    Allergies  Dilaudid: vomiting: Side Effects  Codeine (for allergy): vomiting: Side Effects  Niacin: vomiting: Side Effects   Hospitalization/Major Diagnostic Procedure  see surgeries    Review of Systems  Denies fever/chills, chest pain, SOB, headaches, numbness/tingling. No h/o complication with anesthesia, bleeding disorders or blood clots.   Vital Signs  Wt 176, Wt change 3 lb, Ht 64.5, BMI 29.74, Pulse sitting 81, BP sitting 134/81.   Physical Examination  GENERAL:  Patient appears alert and oriented.  General Appearance: well-appearing, well-developed,  no acute distress.  Speech: clear.  LUNGS:  Auscultation: no wheezing/rhonchi/rales. CTA bilaterally.  HEART:  Heart sounds: normal. RRR. no murmur.  ABDOMEN:  General: soft nontender, nondistended, no masses.  FEMALE GENITOURINARY:  Pelvic Not examined.  EXTREMITIES:  General: No edema or calf tenderness.     Assessments   1. Pre-op exam - V72.84 (Primary)   2. Menometrorrhagia - 626.2   Treatment  1. Pre-op exam  Notes: R/B/A of procedure discussed with pt at length. All questions answered. Consent obtained.    Procedure Codes  16109 POSTOP F U VISIT   Follow Up  2  Weeks post op

## 2013-05-14 ENCOUNTER — Ambulatory Visit (HOSPITAL_COMMUNITY)
Admission: RE | Admit: 2013-05-14 | Discharge: 2013-05-15 | Disposition: A | Discharge status: Home / Self Care | Payer: 59 | Source: Ambulatory Visit | Attending: Obstetrics and Gynecology | Admitting: Obstetrics and Gynecology

## 2013-05-14 ENCOUNTER — Ambulatory Visit (HOSPITAL_COMMUNITY): Payer: 59 | Admitting: Anesthesiology

## 2013-05-14 ENCOUNTER — Encounter (HOSPITAL_COMMUNITY): Payer: 59 | Admitting: Anesthesiology

## 2013-05-14 ENCOUNTER — Encounter (HOSPITAL_COMMUNITY)
Admission: RE | Disposition: A | Discharge status: Home / Self Care | Payer: Self-pay | Source: Ambulatory Visit | Attending: Obstetrics and Gynecology

## 2013-05-14 ENCOUNTER — Encounter (HOSPITAL_COMMUNITY): Payer: Self-pay

## 2013-05-14 DIAGNOSIS — Z90710 Acquired absence of both cervix and uterus: Secondary | ICD-10-CM | POA: Diagnosis present

## 2013-05-14 DIAGNOSIS — N946 Dysmenorrhea, unspecified: Secondary | ICD-10-CM | POA: Insufficient documentation

## 2013-05-14 DIAGNOSIS — N8000 Endometriosis of the uterus, unspecified: Secondary | ICD-10-CM | POA: Insufficient documentation

## 2013-05-14 DIAGNOSIS — D252 Subserosal leiomyoma of uterus: Secondary | ICD-10-CM | POA: Insufficient documentation

## 2013-05-14 DIAGNOSIS — N920 Excessive and frequent menstruation with regular cycle: Secondary | ICD-10-CM | POA: Insufficient documentation

## 2013-05-14 HISTORY — PX: LAPAROSCOPIC ASSISTED VAGINAL HYSTERECTOMY: SHX5398

## 2013-05-14 LAB — BASIC METABOLIC PANEL
BUN: 11 mg/dL (ref 6–23)
CO2: 24 mEq/L (ref 19–32)
Calcium: 8.1 mg/dL — ABNORMAL LOW (ref 8.4–10.5)
Creatinine, Ser: 0.78 mg/dL (ref 0.50–1.10)
GFR calc non Af Amer: >90 mL/min (ref >90)
Glucose, Bld: 113 mg/dL — ABNORMAL HIGH (ref 70–99)
Potassium: 4.2 mEq/L (ref 3.5–5.1)
Sodium: 137 mEq/L (ref 135–145)

## 2013-05-14 LAB — PREGNANCY, URINE: Preg Test, Ur: NEGATIVE

## 2013-05-14 SURGERY — HYSTERECTOMY, VAGINAL, LAPAROSCOPY-ASSISTED
Anesthesia: General | Laterality: Bilateral

## 2013-05-14 MED ORDER — BUPIVACAINE HCL (PF) 0.5 % IJ SOLN
INTRAMUSCULAR | Status: AC
  Filled 2013-05-14: qty 30

## 2013-05-14 MED ORDER — SUMATRIPTAN SUCCINATE 50 MG PO TABS: 50.0000 mg | ORAL_TABLET | ORAL | Status: DC | PRN

## 2013-05-14 MED ORDER — IMIPRAMINE HCL 50 MG PO TABS
50.0000 mg | ORAL_TABLET | Freq: Every day | ORAL | Status: DC
  Administered 2013-05-14: 50 mg via ORAL
  Filled 2013-05-14 (×2): qty 1

## 2013-05-14 MED ORDER — LIDOCAINE HCL (CARDIAC) 20 MG/ML IV SOLN
INTRAVENOUS | Status: DC | PRN
  Administered 2013-05-14: 50 mg via INTRAVENOUS

## 2013-05-14 MED ORDER — KETOROLAC TROMETHAMINE 30 MG/ML IJ SOLN: 30.0000 mg | Freq: Once | INTRAMUSCULAR | Status: DC

## 2013-05-14 MED ORDER — KETOROLAC TROMETHAMINE 30 MG/ML IJ SOLN
INTRAMUSCULAR | Status: AC
  Filled 2013-05-14: qty 1

## 2013-05-14 MED ORDER — BUPIVACAINE HCL (PF) 0.25 % IJ SOLN
INTRAMUSCULAR | Status: AC
  Filled 2013-05-14: qty 30

## 2013-05-14 MED ORDER — DICLOFENAC SODIUM 50 MG PO TBEC
50.0000 mg | DELAYED_RELEASE_TABLET | Freq: Two times a day (BID) | ORAL | Status: DC | PRN
  Filled 2013-05-14: qty 1

## 2013-05-14 MED ORDER — MORPHINE SULFATE 4 MG/ML IJ SOLN: 1.0000 mg | INTRAMUSCULAR | Status: DC | PRN

## 2013-05-14 MED ORDER — PROPOFOL 10 MG/ML IV EMUL
INTRAVENOUS | Status: AC
  Filled 2013-05-14: qty 20

## 2013-05-14 MED ORDER — GLYCOPYRROLATE 0.2 MG/ML IJ SOLN
INTRAMUSCULAR | Status: DC | PRN
  Administered 2013-05-14: 0.4 mg via INTRAVENOUS

## 2013-05-14 MED ORDER — BUPIVACAINE HCL 0.25 % IJ SOLN
INTRAMUSCULAR | Status: DC | PRN
  Administered 2013-05-14: 14 mL

## 2013-05-14 MED ORDER — MIDAZOLAM HCL 2 MG/2ML IJ SOLN
INTRAMUSCULAR | Status: AC
  Filled 2013-05-14: qty 2

## 2013-05-14 MED ORDER — NEOSTIGMINE METHYLSULFATE 1 MG/ML IJ SOLN
INTRAMUSCULAR | Status: DC | PRN
  Administered 2013-05-14: 3 mg via INTRAVENOUS

## 2013-05-14 MED ORDER — PANTOPRAZOLE SODIUM 40 MG PO TBEC
40.0000 mg | DELAYED_RELEASE_TABLET | Freq: Every day | ORAL | Status: DC
  Administered 2013-05-15: 40 mg via ORAL
  Filled 2013-05-14 (×2): qty 1

## 2013-05-14 MED ORDER — DIPHENHYDRAMINE HCL 50 MG/ML IJ SOLN: 12.5000 mg | Freq: Four times a day (QID) | INTRAMUSCULAR | Status: DC | PRN

## 2013-05-14 MED ORDER — LORATADINE 10 MG PO TABS
10.0000 mg | ORAL_TABLET | Freq: Every day | ORAL | Status: DC
  Administered 2013-05-15: 10 mg via ORAL
  Filled 2013-05-14 (×3): qty 1

## 2013-05-14 MED ORDER — FENTANYL CITRATE 0.05 MG/ML IJ SOLN
INTRAMUSCULAR | Status: AC
  Administered 2013-05-14: 25 ug via INTRAVENOUS
  Filled 2013-05-14: qty 2

## 2013-05-14 MED ORDER — MAGNESIUM OXIDE 400 (241.3 MG) MG PO TABS
400.0000 mg | ORAL_TABLET | Freq: Every day | ORAL | Status: DC
  Administered 2013-05-15: 400 mg via ORAL
  Filled 2013-05-14 (×2): qty 1

## 2013-05-14 MED ORDER — VASOPRESSIN 20 UNIT/ML IJ SOLN
INTRAVENOUS | Status: DC | PRN
  Administered 2013-05-14 (×2): via INTRAMUSCULAR

## 2013-05-14 MED ORDER — FENTANYL CITRATE 0.05 MG/ML IJ SOLN
INTRAMUSCULAR | Status: AC
  Filled 2013-05-14: qty 2

## 2013-05-14 MED ORDER — ESTRADIOL 0.1 MG/GM VA CREA
TOPICAL_CREAM | VAGINAL | Status: DC | PRN
  Administered 2013-05-14: 1 via VAGINAL

## 2013-05-14 MED ORDER — ONDANSETRON HCL 4 MG/2ML IJ SOLN
INTRAMUSCULAR | Status: AC
  Filled 2013-05-14: qty 2

## 2013-05-14 MED ORDER — MORPHINE SULFATE (PF) 1 MG/ML IV SOLN
INTRAVENOUS | Status: DC
  Administered 2013-05-14: 16 mg via INTRAVENOUS
  Administered 2013-05-14: 7 mg via INTRAVENOUS
  Administered 2013-05-14: 17:00:00 via INTRAVENOUS
  Administered 2013-05-15: 2 mg via INTRAVENOUS
  Administered 2013-05-15: 14 mg via INTRAVENOUS
  Administered 2013-05-15: 6 mg via INTRAVENOUS
  Filled 2013-05-14 (×2): qty 25

## 2013-05-14 MED ORDER — FENTANYL CITRATE 0.05 MG/ML IJ SOLN
INTRAMUSCULAR | Status: DC | PRN
  Administered 2013-05-14: 50 ug via INTRAVENOUS
  Administered 2013-05-14: 100 ug via INTRAVENOUS
  Administered 2013-05-14: 50 ug via INTRAVENOUS
  Administered 2013-05-14 (×2): 100 ug via INTRAVENOUS

## 2013-05-14 MED ORDER — SODIUM CHLORIDE 0.9 % IJ SOLN: 9.0000 mL | INTRAMUSCULAR | Status: DC | PRN

## 2013-05-14 MED ORDER — MEPERIDINE HCL 25 MG/ML IJ SOLN: 6.2500 mg | INTRAMUSCULAR | Status: DC | PRN

## 2013-05-14 MED ORDER — FENTANYL CITRATE 0.05 MG/ML IJ SOLN
25.0000 ug | INTRAMUSCULAR | Status: DC | PRN
  Administered 2013-05-14 (×2): 25 ug via INTRAVENOUS

## 2013-05-14 MED ORDER — ROCURONIUM BROMIDE 100 MG/10ML IV SOLN
INTRAVENOUS | Status: DC | PRN
  Administered 2013-05-14: 50 mg via INTRAVENOUS

## 2013-05-14 MED ORDER — FENTANYL CITRATE 0.05 MG/ML IJ SOLN
INTRAMUSCULAR | Status: AC
  Filled 2013-05-14: qty 5

## 2013-05-14 MED ORDER — METOCLOPRAMIDE HCL 10 MG PO TABS: 10.0000 mg | ORAL_TABLET | Freq: Four times a day (QID) | ORAL | Status: DC | PRN

## 2013-05-14 MED ORDER — ONDANSETRON HCL 4 MG PO TABS: 4.0000 mg | ORAL_TABLET | Freq: Four times a day (QID) | ORAL | Status: DC | PRN

## 2013-05-14 MED ORDER — GLYCOPYRROLATE 0.2 MG/ML IJ SOLN
INTRAMUSCULAR | Status: AC
  Filled 2013-05-14: qty 3

## 2013-05-14 MED ORDER — HEMOSTATIC AGENTS (NO CHARGE) OPTIME
TOPICAL | Status: DC | PRN
  Administered 2013-05-14: 1

## 2013-05-14 MED ORDER — VASOPRESSIN 20 UNIT/ML IJ SOLN
INTRAMUSCULAR | Status: AC
  Filled 2013-05-14: qty 1

## 2013-05-14 MED ORDER — SODIUM CHLORIDE 0.9 % IJ SOLN
INTRAMUSCULAR | Status: AC
  Filled 2013-05-14: qty 100

## 2013-05-14 MED ORDER — ONDANSETRON HCL 4 MG/2ML IJ SOLN
INTRAMUSCULAR | Status: DC | PRN
  Administered 2013-05-14: 4 mg via INTRAVENOUS

## 2013-05-14 MED ORDER — ONDANSETRON HCL 4 MG/2ML IJ SOLN: 4.0000 mg | Freq: Four times a day (QID) | INTRAMUSCULAR | Status: DC | PRN

## 2013-05-14 MED ORDER — SIMETHICONE 80 MG PO CHEW: 80.0000 mg | CHEWABLE_TABLET | Freq: Four times a day (QID) | ORAL | Status: DC | PRN

## 2013-05-14 MED ORDER — DOCUSATE SODIUM 100 MG PO CAPS
100.0000 mg | ORAL_CAPSULE | Freq: Two times a day (BID) | ORAL | Status: DC
  Administered 2013-05-14 – 2013-05-15 (×2): 100 mg via ORAL
  Filled 2013-05-14 (×2): qty 1

## 2013-05-14 MED ORDER — ESTRADIOL 0.1 MG/GM VA CREA
TOPICAL_CREAM | VAGINAL | Status: AC
  Filled 2013-05-14: qty 42.5

## 2013-05-14 MED ORDER — BUPIVACAINE HCL (PF) 0.25 % IJ SOLN
INTRAMUSCULAR | Status: DC | PRN
  Administered 2013-05-14: 13 mL

## 2013-05-14 MED ORDER — MIDAZOLAM HCL 2 MG/2ML IJ SOLN: 0.5000 mg | Freq: Once | INTRAMUSCULAR | Status: DC | PRN

## 2013-05-14 MED ORDER — ROCURONIUM BROMIDE 100 MG/10ML IV SOLN
INTRAVENOUS | Status: AC
  Filled 2013-05-14: qty 1

## 2013-05-14 MED ORDER — LEVOCETIRIZINE DIHYDROCHLORIDE 5 MG PO TABS: 5.0000 mg | ORAL_TABLET | Freq: Every day | ORAL | Status: DC

## 2013-05-14 MED ORDER — PROMETHAZINE HCL 25 MG/ML IJ SOLN
6.2500 mg | INTRAMUSCULAR | Status: DC | PRN
  Administered 2013-05-14: 6.25 mg via INTRAVENOUS

## 2013-05-14 MED ORDER — IBUPROFEN 600 MG PO TABS: 600.0000 mg | ORAL_TABLET | Freq: Four times a day (QID) | ORAL | Status: DC | PRN

## 2013-05-14 MED ORDER — MAGNESIUM GLUCONATE 500 MG PO TABS: 250.0000 mg | ORAL_TABLET | Freq: Every day | ORAL | Status: DC

## 2013-05-14 MED ORDER — PROMETHAZINE HCL 25 MG/ML IJ SOLN
INTRAMUSCULAR | Status: AC
  Administered 2013-05-14: 6.25 mg via INTRAVENOUS
  Filled 2013-05-14: qty 1

## 2013-05-14 MED ORDER — KETOROLAC TROMETHAMINE 30 MG/ML IJ SOLN: 15.0000 mg | Freq: Once | INTRAMUSCULAR | Status: DC | PRN

## 2013-05-14 MED ORDER — SENNA 8.6 MG PO TABS
1.0000 | ORAL_TABLET | Freq: Two times a day (BID) | ORAL | Status: DC
  Administered 2013-05-14 – 2013-05-15 (×2): 8.6 mg via ORAL
  Filled 2013-05-14 (×4): qty 1

## 2013-05-14 MED ORDER — DIPHENHYDRAMINE HCL 12.5 MG/5ML PO ELIX: 12.5000 mg | ORAL_SOLUTION | Freq: Four times a day (QID) | ORAL | Status: DC | PRN

## 2013-05-14 MED ORDER — LACTATED RINGERS IV SOLN
INTRAVENOUS | Status: DC
  Administered 2013-05-14: 11:00:00 via INTRAVENOUS
  Administered 2013-05-14: 50 mL/h via INTRAVENOUS
  Administered 2013-05-14: 12:00:00 via INTRAVENOUS

## 2013-05-14 MED ORDER — NEOSTIGMINE METHYLSULFATE 1 MG/ML IJ SOLN
INTRAMUSCULAR | Status: AC
  Filled 2013-05-14: qty 1

## 2013-05-14 MED ORDER — TRAMADOL HCL 50 MG PO TABS: 50.0000 mg | ORAL_TABLET | Freq: Four times a day (QID) | ORAL | Status: DC | PRN

## 2013-05-14 MED ORDER — MENTHOL 3 MG MT LOZG: 1.0000 | LOZENGE | OROMUCOSAL | Status: DC | PRN

## 2013-05-14 MED ORDER — SIMVASTATIN 10 MG PO TABS
10.0000 mg | ORAL_TABLET | Freq: Every day | ORAL | Status: DC
  Filled 2013-05-14 (×2): qty 1

## 2013-05-14 MED ORDER — LIDOCAINE HCL (CARDIAC) 20 MG/ML IV SOLN
INTRAVENOUS | Status: AC
  Filled 2013-05-14: qty 5

## 2013-05-14 MED ORDER — LACTATED RINGERS IV SOLN
INTRAVENOUS | Status: DC
  Administered 2013-05-14 – 2013-05-15 (×2): via INTRAVENOUS

## 2013-05-14 MED ORDER — PROPOFOL 10 MG/ML IV BOLUS
INTRAVENOUS | Status: DC | PRN
  Administered 2013-05-14: 200 mg via INTRAVENOUS

## 2013-05-14 MED ORDER — KETOROLAC TROMETHAMINE 30 MG/ML IJ SOLN
INTRAMUSCULAR | Status: DC | PRN
  Administered 2013-05-14: 30 mg via INTRAVENOUS

## 2013-05-14 MED ORDER — CEFAZOLIN SODIUM-DEXTROSE 2-3 GM-% IV SOLR
2.0000 g | INTRAVENOUS | Status: AC
  Administered 2013-05-14: 2 g via INTRAVENOUS

## 2013-05-14 MED ORDER — NALOXONE HCL 0.4 MG/ML IJ SOLN: 0.4000 mg | INTRAMUSCULAR | Status: DC | PRN

## 2013-05-14 MED ORDER — ONDANSETRON HCL 4 MG/2ML IJ SOLN
4.0000 mg | Freq: Four times a day (QID) | INTRAMUSCULAR | Status: DC | PRN
  Administered 2013-05-14: 4 mg via INTRAVENOUS
  Filled 2013-05-14: qty 2

## 2013-05-14 MED ORDER — MIDAZOLAM HCL 2 MG/2ML IJ SOLN
INTRAMUSCULAR | Status: DC | PRN
  Administered 2013-05-14: 2 mg via INTRAVENOUS

## 2013-05-14 SURGICAL SUPPLY — 50 items
APPLICATOR COTTON TIP 6IN STRL (MISCELLANEOUS) ×2 IMPLANT
BENZOIN TINCTURE PRP APPL 2/3 (GAUZE/BANDAGES/DRESSINGS) ×2 IMPLANT
CHLORAPREP W/TINT 26ML (MISCELLANEOUS) ×2 IMPLANT
CLOTH BEACON ORANGE TIMEOUT ST (SAFETY) ×2 IMPLANT
CONT PATH 16OZ SNAP LID 3702 (MISCELLANEOUS) ×2 IMPLANT
COVER TABLE BACK 60X90 (DRAPES) ×2 IMPLANT
DECANTER SPIKE VIAL GLASS SM (MISCELLANEOUS) ×2 IMPLANT
DERMABOND ADVANCED (GAUZE/BANDAGES/DRESSINGS) ×1
DERMABOND ADVANCED .7 DNX12 (GAUZE/BANDAGES/DRESSINGS) ×1 IMPLANT
ELECT LIGASURE LONG (ELECTRODE) IMPLANT
ELECT LIGASURE SHORT 9 REUSE (ELECTRODE) ×2 IMPLANT
ELECT REM PT RETURN 9FT ADLT (ELECTROSURGICAL) ×2
ELECTRODE REM PT RTRN 9FT ADLT (ELECTROSURGICAL) ×1 IMPLANT
FILTER SMOKE EVAC LAPAROSHD (FILTER) ×2 IMPLANT
FORCEPS CUTTING 33CM 5MM (CUTTING FORCEPS) IMPLANT
GAUZE PACKING IODOFORM 1 (PACKING) ×2 IMPLANT
GAUZE PACKING IODOFORM 1/4X5 (PACKING) ×2 IMPLANT
GLOVE BIOGEL M 6.5 STRL (GLOVE) ×8 IMPLANT
GLOVE BIOGEL PI IND STRL 7.0 (GLOVE) ×2 IMPLANT
GLOVE BIOGEL PI INDICATOR 7.0 (GLOVE) ×2
GLOVE ECLIPSE 6.0 STRL STRAW (GLOVE) ×2 IMPLANT
GOWN PREVENTION PLUS XLARGE (GOWN DISPOSABLE) ×2 IMPLANT
GOWN STRL REIN XL XLG (GOWN DISPOSABLE) ×8 IMPLANT
MANIPULATOR UTERINE 4.5 ZUMI (MISCELLANEOUS) IMPLANT
NS IRRIG 1000ML POUR BTL (IV SOLUTION) ×2 IMPLANT
PACK LAVH (CUSTOM PROCEDURE TRAY) ×2 IMPLANT
PROTECTOR NERVE ULNAR (MISCELLANEOUS) ×4 IMPLANT
SCALPEL HARMONIC ACE (MISCELLANEOUS) ×2 IMPLANT
SET IRRIG TUBING LAPAROSCOPIC (IRRIGATION / IRRIGATOR) IMPLANT
SOLUTION ELECTROLUBE (MISCELLANEOUS) IMPLANT
STRIP CLOSURE SKIN 1/4X4 (GAUZE/BANDAGES/DRESSINGS) ×2 IMPLANT
SURGIFLO ENDOSCOPIC APPLICATOR ×2 IMPLANT
SURGIFLO W/THROMBIN 8M KIT (HEMOSTASIS) ×2 IMPLANT
SUT CHROMIC 2 0 SH (SUTURE) IMPLANT
SUT CHROMIC 2 0 UR 5 27 (SUTURE) IMPLANT
SUT MNCRL AB 4-0 PS2 18 (SUTURE) IMPLANT
SUT VIC AB 0 CT1 18XCR BRD8 (SUTURE) ×2 IMPLANT
SUT VIC AB 0 CT1 36 (SUTURE) ×2 IMPLANT
SUT VIC AB 0 CT1 8-18 (SUTURE) ×2
SUT VIC AB 2-0 CT1 (SUTURE) ×2 IMPLANT
SUT VICRYL 0 UR6 27IN ABS (SUTURE) ×2 IMPLANT
SUT VICRYL 1 TIES 12X18 (SUTURE) ×2 IMPLANT
SUT VICRYL 4-0 PS2 18IN ABS (SUTURE) ×2 IMPLANT
TOWEL OR 17X24 6PK STRL BLUE (TOWEL DISPOSABLE) ×4 IMPLANT
TRAY FOLEY CATH 14FR (SET/KITS/TRAYS/PACK) ×2 IMPLANT
TROCAR XCEL NON-BLD 11X100MML (ENDOMECHANICALS) IMPLANT
TROCAR XCEL NON-BLD 5MMX100MML (ENDOMECHANICALS) ×2 IMPLANT
TROCAR XCEL OPT SLVE 5M 100M (ENDOMECHANICALS) ×4 IMPLANT
WARMER LAPAROSCOPE (MISCELLANEOUS) IMPLANT
WATER STERILE IRR 1000ML POUR (IV SOLUTION) IMPLANT

## 2013-05-14 NOTE — Brief Op Note (Signed)
05/14/2013  2:18 PM  PATIENT:  Angela Franklin  47 y.o. female  PRE-OPERATIVE DIAGNOSIS:  Menorrhagia, dysmenorrhea  POST-OPERATIVE DIAGNOSIS:  Same, endometriosis  PROCEDURE:  Procedure(s): LAPAROSCOPIC ASSISTED VAGINAL HYSTERECTOMY , BILATERAL SALPINGECTOMY (Bilateral)  SURGEON:  Surgeon(s) and Role:    * Geryl Rankins, MD - Primary    * Sharon Seller, DO - Assisting  PHYSICIAN ASSISTANT: Dr. Charlotta Newton  ASSISTANTS: Technician x 2   ANESTHESIA:   general  EBL:  Total I/O In: 1000 [I.V.:1000] Out: 500 [Urine:350; Blood:150]  BLOOD ADMINISTERED:none  DRAINS: Urinary Catheter (Foley)   LOCAL MEDICATIONS USED:  0.25% MARCAINE    and OTHER Vasopressin 20 units/100 ml   SPECIMEN:  Source of Specimen:  uterus w/ cervix, bilateral fallopian tubes  DISPOSITION OF SPECIMEN:  PATHOLOGY  COUNTS:  YES  TOURNIQUET:  * No tourniquets in log *  DICTATION: .Other Dictation: Dictation Number [id number]  PLAN OF CARE: Admit for overnight observation  PATIENT DISPOSITION:  PACU - hemodynamically stable.   Delay start of Pharmacological VTE agent (>24hrs) due to surgical blood loss or risk of bleeding: yes

## 2013-05-14 NOTE — Interval H&P Note (Signed)
History and Physical Interval Note:  05/14/2013 11:24 AM  Angela Franklin  has presented today for surgery, with the diagnosis of Menorrhagia  The various methods of treatment have been discussed with the patient and family. After consideration of risks, benefits and other options for treatment, the patient has consented to  Procedure(s): LAPAROSCOPIC ASSISTED VAGINAL HYSTERECTOMY (N/A) as a surgical intervention .  The patient's history has been reviewed, patient examined, no change in status, stable for surgery.  I have reviewed the patient's chart and labs.  Questions were answered to the patient's satisfaction.    Remote h/o IBS, ?Colitis.  Controlled with Align.  Bilateral salpingectomy discussed with pt and added to consent. Dion Body, Tyri Elmore

## 2013-05-14 NOTE — Transfer of Care (Signed)
Immediate Anesthesia Transfer of Care Note  Patient: Angela Franklin  Procedure(s) Performed: Procedure(s): LAPAROSCOPIC ASSISTED VAGINAL HYSTERECTOMY , BILATERAL SALPINGECTOMY (Bilateral)  Patient Location: PACU  Anesthesia Type:General  Level of Consciousness: awake, alert  and oriented  Airway & Oxygen Therapy: Patient Spontanous Breathing and Patient connected to nasal cannula oxygen  Post-op Assessment: Report given to PACU RN and Post -op Vital signs reviewed and stable  Post vital signs: Reviewed and stable  Complications: No apparent anesthesia complications

## 2013-05-14 NOTE — Anesthesia Preprocedure Evaluation (Addendum)
Anesthesia Evaluation  Patient identified by MRN, date of birth, ID band Patient awake    Reviewed: Allergy & Precautions, H&P , Patient's Chart, lab work & pertinent test results, reviewed documented beta blocker date and time   History of Anesthesia Complications Negative for: history of anesthetic complications  Airway Mallampati: III TM Distance: <3 FB Neck ROM: full  Mouth opening: Limited Mouth Opening  Dental   Pulmonary  breath sounds clear to auscultation        Cardiovascular Exercise Tolerance: Good Rhythm:regular Rate:Normal     Neuro/Psych  Headaches,    GI/Hepatic GERD-  Controlled,  Endo/Other    Renal/GU      Musculoskeletal   Abdominal   Peds  Hematology   Anesthesia Other Findings   Reproductive/Obstetrics                          Anesthesia Physical Anesthesia Plan  ASA: II  Anesthesia Plan: General ETT   Post-op Pain Management:    Induction:   Airway Management Planned: Video Laryngoscope Planned  Additional Equipment:   Intra-op Plan:   Post-operative Plan:   Informed Consent: I have reviewed the patients History and Physical, chart, labs and discussed the procedure including the risks, benefits and alternatives for the proposed anesthesia with the patient or authorized representative who has indicated his/her understanding and acceptance.   Dental Advisory Given  Plan Discussed with: CRNA and Surgeon  Anesthesia Plan Comments:        Anesthesia Quick Evaluation

## 2013-05-14 NOTE — Anesthesia Postprocedure Evaluation (Signed)
Anesthesia Post Note  Patient: Angela Franklin  Procedure(s) Performed: Procedure(s) (LRB): LAPAROSCOPIC ASSISTED VAGINAL HYSTERECTOMY , BILATERAL SALPINGECTOMY (Bilateral)  Anesthesia type: General  Patient location: PACU  Post pain: Pain level controlled  Post assessment: Post-op Vital signs reviewed  Last Vitals:  Filed Vitals:   05/14/13 1530  BP:   Pulse: 65  Temp:   Resp: 14    Post vital signs: Reviewed  Level of consciousness: sedated  Complications: No apparent anesthesia complications

## 2013-05-14 NOTE — Anesthesia Procedure Notes (Signed)
Procedure Name: Intubation Date/Time: 05/14/2013 11:50 AM Performed by: Shanon Payor Pre-anesthesia Checklist: Patient being monitored, Suction available, Emergency Drugs available, Patient identified and Timeout performed Patient Re-evaluated:Patient Re-evaluated prior to inductionOxygen Delivery Method: Circle system utilized Preoxygenation: Pre-oxygenation with 100% oxygen Intubation Type: IV induction Ventilation: Mask ventilation without difficulty Laryngoscope Size: Mac and 3 Grade View: Grade I Tube type: Oral Tube size: 7.0 mm Number of attempts: 1 Airway Equipment and Method: Stylet Placement Confirmation: ETT inserted through vocal cords under direct vision,  positive ETCO2 and breath sounds checked- equal and bilateral Secured at: 21 cm Tube secured with: Tape Dental Injury: Teeth and Oropharynx as per pre-operative assessment

## 2013-05-15 LAB — CBC
MCHC: 33.6 g/dL (ref 30.0–36.0)
Platelets: 280 10*3/uL (ref 150–400)
RBC: 3.93 MIL/uL (ref 3.87–5.11)
RDW: 12.8 % (ref 11.5–15.5)

## 2013-05-15 MED ORDER — TRAMADOL HCL 50 MG PO TABS: ORAL_TABLET | ORAL | Status: DC

## 2013-05-15 MED ORDER — IBUPROFEN 600 MG PO TABS: 600.0000 mg | ORAL_TABLET | Freq: Four times a day (QID) | ORAL | Status: DC | PRN

## 2013-05-15 NOTE — Progress Notes (Signed)
Patient's vaginal packing removed. Scant drainage, no clots present. Patient tolerated well. 

## 2013-05-15 NOTE — Anesthesia Postprocedure Evaluation (Signed)
  Anesthesia Post-op Note  Patient: Angela Franklin  Procedure(s) Performed: Procedure(s): LAPAROSCOPIC ASSISTED VAGINAL HYSTERECTOMY , BILATERAL SALPINGECTOMY (Bilateral)  Patient Location: [facility]  Anesthesia Type:General  Level of Consciousness: awake and alert   Airway and Oxygen Therapy: Patient Spontanous Breathing and Patient connected to nasal cannula oxygen  Post-op Pain: mild  Post-op Assessment: Patient's Cardiovascular Status Stable, Respiratory Function Stable, No signs of Nausea or vomiting, Pain level controlled, No headache, No residual numbness and No residual motor weakness  Post-op Vital Signs: stable  Complications: No apparent anesthesia complications

## 2013-05-18 ENCOUNTER — Encounter (HOSPITAL_COMMUNITY): Payer: Self-pay | Admitting: Obstetrics and Gynecology

## 2013-05-26 NOTE — Op Note (Signed)
Angela Franklin, Angela Franklin                ACCOUNT NO.:  [id number]  MEDICAL RECORD NO.:  [id number]  LOCATION:  9315                          FACILITY:  [facility]  PHYSICIAN:  Pieter Partridge, MD   DATE OF BIRTH:  1966-01-09  DATE OF PROCEDURE:  05/14/2013 DATE OF DISCHARGE:  05/15/2013                              OPERATIVE REPORT   PREOPERATIVE DIAGNOSES:  Menorrhagia, dysmenorrhea.  POSTOPERATIVE DIAGNOSES:  Menorrhagia, dysmenorrhea, endometriosis.  PROCEDURE:  Laparoscopic-assisted vaginal hysterectomy with bilateral salpingectomy.  SURGEON:  Pieter Partridge, MD.  ASSISTANT:  Dr. Janeice Robinson, assistant technician x2.  ANESTHESIA:  General.  EBL:  150.  URINE OUTPUT:  350 clear at end of procedure.  DRAINS:  Foley catheter, local at 0.25% Marcaine and vasopressin 20 units and 100.  SPECIMEN:  Uterus with cervix and bilateral fallopian tubes.  DISPOSITION:  Specimen to Pathology.  The patient's disposition to PACU, hemodynamically stable.  FINDINGS:  Small uterus with normal fallopian tubes and ovaries. However scarring noted due to endometriosis.  Cul-de-sac is scarred and endometrial implant on the right ovary.  Adhesions of the colon to the posterior uterus.  Normal liver edge and upper abdomen.  PROCEDURE IN DETAIL:  Angela Franklin was taken back to the operating room where she underwent general endotracheal anesthesia without complication.  She was then prepped and draped in normal sterile fashion after being placed in the dorsal lithotomy position.  A uterine manipulator Hulka was inserted for mobilization of the uterus with the assistance of a single-tooth tenaculum.  Attention was turned to the abdomen and 0.25% Marcaine was injected at the periumbilical site.  The patient was then placed in Trendelenburg and the fascia was then entered with a 5-mm trocar.  Laparoscopic visualization confirmed intraabdominal access.  Two ports in the bilateral lower quadrants were placed  under direct visualization atraumatically.  Blunt probe and atraumatic grasper were used to reveal the findings above.  The Harmonic Scalpel was used to transect the round ligament and the fallopian tubes and bladder flap was developed with the Harmonic Scalpel.  There were some adhesions in the cul-de-sac and cautery was used as necessary.  There were no significant bowel adhesions or any suspicion of any bowel injury.  Once the upper ligaments were transected, attention was then turned to the vagina.  The patient was placed later, extended upward with the scalpel.  Weighted speculum was inserted into the vagina and a narrow Deaver for visualization of the cervix.  Vasopressin was injected at the vesicocervical mucosa.  The mucosa was then incised with the Bovie cautery.  I entered the posterior cul-de-sac first.  It was scarred and thickened, but there was no evidence of any trauma to the bowel.  I then entered the anterior cul-de- sac without significant difficulty.  The uterosacral ligaments were then tagged and cut, and suture ligated with 0 Vicryl.  The LigaSure was then used to transect the broad ligament atraumatically up into the uterine fundus.  There was some bleeding noted on the right-hand side, which was addressed laparoscopically prior to closure, but the uterine specimen was removed in its entirety without difficulty.  The vaginal cuff was then reapproximated in a  continuous running fashion with 0 Vicryl suture.  Prior to closure of the cuff, the angles of the incision were oversewn with a 0 Vicryl.  Minimal bleeding was noted at the cuff.  Attention was then turned back to the abdomen.  The cuff seemed appear a little bit oozy.  Hemostasis was used as needed.  We did place Surgiflo into the vaginal cuff.  Again, the bowel appeared to be normal.  All instrument, sponge, and needle counts were correct x3.  The patient was taken to the recovery room in stable  condition.     Pieter Partridge, MD     EBV/MEDQ  D:  05/25/2013  T:  05/26/2013  Job:  [id number]

## 2013-06-16 NOTE — Discharge Summary (Signed)
Physician Discharge Summary  Patient ID: Angela Franklin MRN: [id number] DOB/AGE: 49-Mar-1967 49 y.o.  Admit date: 05/14/2013 Discharge date: 06/16/2013  Admission Diagnoses:  Menorrhagia, Dysmenorrhea  Discharge Diagnoses:  Endometriosis Active Problems:   S/P vaginal hysterectomy   Discharged Condition: Good  Hospital Course: Routine post op course.  Surgery and recovery uncomplicated.  No migraine headaches during hospitalization.  Consults: None  Significant Diagnostic Studies: Hg 13.7 to 11.8.  BUN/Cr post op was nl.  Treatments: LAVH  Discharge Exam: Blood pressure 101/56, pulse 76, temperature 97.8 F (36.6 C), temperature source Oral, resp. rate 18, height 5\' 4"  (1.626 m), weight 79.833 kg (176 lb), SpO2 97.00%. Gen:  NAD Abdomen:  Incisions clean and dry.  Slightly distended. Ext:  No edema or calf tenderness.  Disposition: 01-Home or Self Care  Discharge Orders   Future Orders Complete By Expires   Call MD for:  difficulty breathing, headache or visual disturbances  As directed    Call MD for:  extreme fatigue  As directed    Call MD for:  persistant dizziness or light-headedness  As directed    Call MD for:  persistant nausea and vomiting  As directed    Call MD for:  redness, tenderness, or signs of infection (pain, swelling, redness, odor or green/yellow discharge around incision site)  As directed    Call MD for:  severe uncontrolled pain  As directed    Diet - low sodium heart healthy  As directed    Discharge instructions  As directed    Comments:     See discharge instructions.   Increase activity slowly  As directed    No wound care  As directed    Sexual Activity Restrictions  As directed    Comments:     Pelvic rest x 6 weeks.       Medication List         ALIGN PO  Take 1 tablet by mouth daily.     CAMBIA 50 MG Pack  Generic drug:  Diclofenac Potassium  Take 50 mg by mouth as needed.     fluticasone 27.5 MCG/SPRAY nasal spray  Commonly  known as:  VERAMYST  Place 2 sprays into the nose daily as needed for rhinitis.     ibuprofen 600 MG tablet  Commonly known as:  ADVIL,MOTRIN  Take 1 tablet (600 mg total) by mouth every 6 (six) hours as needed (mild pain).     imipramine 50 MG tablet  Commonly known as:  TOFRANIL  Take 50 mg by mouth at bedtime.     levocetirizine 5 MG tablet  Commonly known as:  XYZAL  Take 5 mg by mouth daily.     magnesium gluconate 500 MG tablet  Commonly known as:  MAGONATE  Take 250 mg by mouth daily.     metoCLOPramide 10 MG tablet  Commonly known as:  REGLAN  Take 10 mg by mouth every 6 (six) hours as needed for nausea.     omeprazole 20 MG capsule  Commonly known as:  PRILOSEC  Take 20 mg by mouth daily.     pravastatin 20 MG tablet  Commonly known as:  PRAVACHOL  Take 20 mg by mouth daily.     rizatriptan 10 MG tablet  Commonly known as:  MAXALT  Take 10 mg by mouth as needed for migraine. May repeat in 2 hours if needed     traMADol 50 MG tablet  Commonly known as:  Veatrice Bourbon  1 tablet every 6 hours prn pain.  May take 2 tabs every 6 hours for moderate to severe pain.           Follow-up Information   Follow up with Thurnell Lose, MD. Schedule an appointment as soon as possible for a visit in 2 weeks. (Post op check)    Specialty:  Obstetrics and Gynecology   Contact information:   [address] [place] [address] [phone]       Signed: Thurnell Lose 06/16/2013, 12:02 AM

## 2013-10-28 ENCOUNTER — Encounter: Payer: Self-pay | Admitting: Cardiology

## 2013-11-11 ENCOUNTER — Ambulatory Visit (INDEPENDENT_AMBULATORY_CARE_PROVIDER_SITE_OTHER): Payer: 59

## 2013-11-11 ENCOUNTER — Encounter: Payer: Self-pay | Admitting: Podiatry

## 2013-11-11 ENCOUNTER — Ambulatory Visit (INDEPENDENT_AMBULATORY_CARE_PROVIDER_SITE_OTHER): Payer: 59 | Admitting: Podiatry

## 2013-11-11 VITALS — BP 119/76 | HR 85 | Resp 16 | Ht 64.0 in | Wt 170.0 lb

## 2013-11-11 DIAGNOSIS — M722 Plantar fascial fibromatosis: Secondary | ICD-10-CM

## 2013-11-11 DIAGNOSIS — M7750 Other enthesopathy of unspecified foot: Secondary | ICD-10-CM

## 2013-11-11 MED ORDER — TRIAMCINOLONE ACETONIDE 10 MG/ML IJ SUSP
10.0000 mg | Freq: Once | INTRAMUSCULAR | Status: AC
  Administered 2013-11-11: 10 mg

## 2013-11-11 NOTE — Progress Notes (Signed)
Subjective:     Patient ID: Angela Franklin, female   DOB: 1966/03/12, 48 y.o.   MRN: [id number]  HPI patient presents stating I'm having a lot of pain on the outside of my right foot and also into my right arch. Been going on for several months   Review of Systems     Objective:   Physical Exam Neurovascular status intact with no health history changes noted and discomfort at the peroneal insertion base of fifth metatarsal right with inflammation and fluid buildup and moderate discomfort within the distal plantar arch    Assessment:     Tendinitis right foot along with plantar fasciitis right    Plan:     H&P and x-ray reviewed and discussed both conditions. I focused on the outside injected 3 mg Kenalog 5 of Xylocaine Marcaine mixture into the base of the fifth metatarsal and applied fascially brace to stabilize the foot. Reappoint her recheck in 2-3 weeks

## 2013-11-11 NOTE — Progress Notes (Signed)
   Subjective:    Patient ID: Angela Franklin, female    DOB: 01-23-1966, 47 y.o.   MRN: [id number]  HPI Comments: Been having right foot pain along the lateral side, and the medial heel pain   Foot Pain      Review of Systems     Objective:   Physical Exam        Assessment & Plan:

## 2014-01-15 ENCOUNTER — Encounter (INDEPENDENT_AMBULATORY_CARE_PROVIDER_SITE_OTHER): Payer: Self-pay

## 2014-01-15 ENCOUNTER — Ambulatory Visit (INDEPENDENT_AMBULATORY_CARE_PROVIDER_SITE_OTHER): Payer: 59 | Admitting: Psychology

## 2014-01-15 ENCOUNTER — Encounter (HOSPITAL_COMMUNITY): Payer: Self-pay | Admitting: Psychology

## 2014-01-15 DIAGNOSIS — F4322 Adjustment disorder with anxiety: Secondary | ICD-10-CM
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
## 2014-01-25 ENCOUNTER — Ambulatory Visit (INDEPENDENT_AMBULATORY_CARE_PROVIDER_SITE_OTHER): Payer: 59 | Admitting: Podiatry

## 2014-01-25 ENCOUNTER — Encounter: Payer: Self-pay | Admitting: Podiatry

## 2014-01-25 ENCOUNTER — Ambulatory Visit: Payer: 59 | Admitting: Podiatry

## 2014-01-25 VITALS — BP 124/76 | HR 90 | Resp 12

## 2014-01-25 DIAGNOSIS — M7750 Other enthesopathy of unspecified foot: Secondary | ICD-10-CM

## 2014-01-25 MED ORDER — TRIAMCINOLONE ACETONIDE 10 MG/ML IJ SUSP
10.0000 mg | Freq: Once | INTRAMUSCULAR | Status: AC
  Administered 2014-01-25: 10 mg

## 2014-01-25 MED ORDER — TRIAMCINOLONE ACETONIDE 10 MG/ML IJ SUSP
10.0000 mg | Freq: Once | INTRAMUSCULAR | Status: DC
  Administered 2014-01-25: 10 mg

## 2014-01-27 NOTE — Progress Notes (Signed)
Subjective:     Patient ID: Angela Franklin, female   DOB: Nov 03, 1965, 48 y.o.   MRN: [id number]  HPI patient states I'm doing better but it hurts under my ankle versus where it was previously   Review of Systems     Objective:   Physical Exam Neurovascular status intact with good muscle strength and noted peroneal insertion base fifth metatarsal is better but it is more now proximal within the plantar lateral ankle    Assessment:     Tendinitis more within the peroneal tendon versus insertion    Plan:     Injected the sheath 3 mg Kenalog 5 g like Marcaine mixture and advised on physical therapy

## 2014-02-16 ENCOUNTER — Ambulatory Visit
Admission: RE | Admit: 2014-02-16 | Discharge: 2014-02-16 | Disposition: A | Discharge status: Home / Self Care | Payer: 59 | Source: Ambulatory Visit | Attending: Family Medicine | Admitting: Family Medicine

## 2014-02-16 ENCOUNTER — Other Ambulatory Visit: Payer: Self-pay | Admitting: Family Medicine

## 2014-02-16 DIAGNOSIS — R05 Cough: Secondary | ICD-10-CM

## 2014-02-16 DIAGNOSIS — R059 Cough, unspecified: Secondary | ICD-10-CM

## 2014-02-19 ENCOUNTER — Ambulatory Visit (INDEPENDENT_AMBULATORY_CARE_PROVIDER_SITE_OTHER): Payer: 59 | Admitting: Psychology

## 2014-02-19 DIAGNOSIS — F4322 Adjustment disorder with anxiety: Secondary | ICD-10-CM

## 2014-02-19 NOTE — Progress Notes (Signed)
   THERAPIST PROGRESS NOTE  Session Time: 8.05am-8.48am  Participation Level: Active  Behavioral Response: Well GroomedAlert, Stressed  Type of Therapy: Individual Therapy  Treatment Goals addressed: Diagnosis: Adjustment D/O and goal 1.  Interventions: Strength-based and Meditation: Mindfulnes breathing exercise  Summary: Angela Franklin is a 48 y.o. female who presents with affect full and bright.  Pt reports that she is still experiencing stress at work w/ dynamics of coworkers. Pt reported however that she has been not as irritable and less craving of comfort foods. Pt reports that this may have been related to change in medication- d/c Lupron several months ago- as side effects of weight gain, increased moodiness could have been side effects.  Pt reports that she also has made some healthy changes for self- recently joined the [facility] and plans for beginning workout in afternoons for stress reduction.  Pt participated in mindfulness and expressed feeling relief and relaxed.  Pt identified ways to practice and incorporate into her day.      Suicidal/Homicidal: Nowithout intent/plan  Therapist Response: Assessed pt current functioning per pt report.  Processed w/pt stressors and steps pt is taking for stress management.  Introduced pt to mindful breathing exercises and led pt in session.  Processed w/pt the benefit and discussed how to implement for self.    Plan: Return again in 4 weeks. Pt to practice mindful breathing  Diagnosis: Axis I: Adjustment Disorder with Anxiety    Axis II: No diagnosis    Drey Shaff, LPC 02/19/2014

## 2014-03-05 ENCOUNTER — Ambulatory Visit (HOSPITAL_COMMUNITY): Payer: Self-pay | Admitting: Psychology

## 2014-03-19 ENCOUNTER — Ambulatory Visit (HOSPITAL_COMMUNITY): Payer: Self-pay | Admitting: Psychology

## 2014-04-02 ENCOUNTER — Ambulatory Visit (HOSPITAL_COMMUNITY): Payer: Self-pay | Admitting: Psychology

## 2014-04-16 ENCOUNTER — Ambulatory Visit (HOSPITAL_COMMUNITY): Payer: Self-pay | Admitting: Psychology

## 2014-06-11 HISTORY — PX: ABDOMINOPLASTY: SUR9

## 2014-06-25 ENCOUNTER — Encounter (HOSPITAL_COMMUNITY): Payer: Self-pay | Admitting: Psychology

## 2014-08-03 ENCOUNTER — Encounter: Payer: Self-pay | Admitting: Podiatry

## 2014-08-03 ENCOUNTER — Ambulatory Visit (INDEPENDENT_AMBULATORY_CARE_PROVIDER_SITE_OTHER): Payer: 59 | Admitting: Podiatry

## 2014-08-03 VITALS — BP 110/74 | HR 103 | Resp 16

## 2014-08-03 DIAGNOSIS — M779 Enthesopathy, unspecified: Secondary | ICD-10-CM | POA: Diagnosis not present

## 2014-08-03 MED ORDER — TRIAMCINOLONE ACETONIDE 10 MG/ML IJ SUSP
10.0000 mg | Freq: Once | INTRAMUSCULAR | Status: AC
  Administered 2014-08-03: 10 mg

## 2014-08-03 NOTE — Progress Notes (Signed)
Subjective:     Patient ID: Angela Franklin, female   DOB: [birth year], 49 y.o.   MRN: [id number]  HPI patient states that the outside of her foot has been doing well but she's getting pain on the inside of her ankle and it's been occurring for the last few weeks. She is now a diet-controlled diabetic and has increased her exercise   Review of Systems     Objective:   Physical Exam Neurovascular status was intact with no other changes in health and I noted that there is some discomfort on the medial aspect of the right ankle around the anterior tibial insertion with adequate muscle strength noted    Assessment:     Anterior tibial tendinitis right with inflammation noted    Plan:

## 2014-10-08 ENCOUNTER — Ambulatory Visit (INDEPENDENT_AMBULATORY_CARE_PROVIDER_SITE_OTHER): Payer: 59

## 2014-10-08 ENCOUNTER — Ambulatory Visit (INDEPENDENT_AMBULATORY_CARE_PROVIDER_SITE_OTHER): Payer: 59 | Admitting: Podiatry

## 2014-10-08 ENCOUNTER — Encounter: Payer: Self-pay | Admitting: Podiatry

## 2014-10-08 VITALS — BP 121/72 | HR 84 | Resp 12

## 2014-10-08 DIAGNOSIS — M779 Enthesopathy, unspecified: Secondary | ICD-10-CM

## 2014-10-08 DIAGNOSIS — M79671 Pain in right foot: Secondary | ICD-10-CM

## 2014-10-08 MED ORDER — TRIAMCINOLONE ACETONIDE 10 MG/ML IJ SUSP
10.0000 mg | Freq: Once | INTRAMUSCULAR | Status: AC
  Administered 2014-10-08: 10 mg

## 2014-10-08 MED ORDER — DICLOFENAC SODIUM 75 MG PO TBEC: 75.0000 mg | DELAYED_RELEASE_TABLET | Freq: Two times a day (BID) | ORAL | Status: DC

## 2014-10-08 NOTE — Progress Notes (Signed)
   Subjective:    Patient ID: Angela Franklin, female    DOB: Mar 28, 1966, 49 y.o.   MRN: [id number]  HPI Comments: Pt states it seems the pain is changing from a radiating pain to a sharp sticking pain.  Foot Pain      Review of Systems  All other systems reviewed and are negative.      Objective:   Physical Exam        Assessment & Plan:

## 2014-10-09 NOTE — Progress Notes (Signed)
Subjective:     Patient ID: Angela Franklin, female   DOB: Jan 30, 1966, 49 y.o.   MRN: [id number]  HPI patient presents stating I'm still getting pain in my right foot that while not exactly in the same place it still very bothersome for me   Review of Systems     Objective:   Physical Exam Neurovascular status intact muscle strength adequate with quite a bit of discomfort in the medial side of the right ankle medial to the anterior tibial tendon with what appears to be more of a ligament involvement within the ankle    Assessment:     Inflammatory changes with pain that has not receded with previous reduced activity and injection treatment    Plan:     Advised on importance of physical therapy and did do a more medial injection 3 mg Kenalog 5 mg Xylocaine and then applied air fracture walker to completely immobilize and allow a reduction of inflammation. Reappoint to reach C again in 4 weeks and may require MRI if symptoms were to persist

## 2014-11-05 ENCOUNTER — Ambulatory Visit: Payer: 59 | Admitting: Podiatry

## 2014-11-10 ENCOUNTER — Ambulatory Visit (INDEPENDENT_AMBULATORY_CARE_PROVIDER_SITE_OTHER): Payer: 59 | Admitting: Podiatry

## 2014-11-10 ENCOUNTER — Encounter: Payer: Self-pay | Admitting: Podiatry

## 2014-11-10 VITALS — BP 102/69 | HR 83 | Resp 12

## 2014-11-10 DIAGNOSIS — M779 Enthesopathy, unspecified: Secondary | ICD-10-CM | POA: Diagnosis not present

## 2014-11-10 NOTE — Progress Notes (Signed)
Subjective:     Patient ID: Angela Franklin, female   DOB: 1965/12/26, 49 y.o.   MRN: [id number]  HPI patient states it's feeling quite a bit better and the boot has really helped me   Review of Systems     Objective:   Physical Exam Neurovascular status intact muscle strength adequate with significant diminishment of discomfort in the insertion of the anterior tibial tendon into the right medial foot    Assessment:     Doing much better with tendinitis right    Plan:     Reviewed condition and advised on continued boot usage and gradual reduction of the next 4 weeks. Patient be seen back as needed if symptoms persist and we may need to make her orthotics

## 2014-12-15 ENCOUNTER — Encounter (HOSPITAL_COMMUNITY): Payer: Self-pay | Admitting: Psychology

## 2014-12-15 DIAGNOSIS — F4322 Adjustment disorder with anxiety: Secondary | ICD-10-CM

## 2014-12-15 NOTE — Progress Notes (Signed)
Angela Franklin is a 49 y.o. female patient discharged from counseling as last seen on 02/19/14.  Outpatient Therapist Discharge Summary  Angela Franklin    [birth year]   Admission Date: 01/15/14   Discharge Date:  12/15/14 Reason for Discharge:  Not active Diagnosis:    Adjustment disorder with anxiety at last appointment    Comments:  Pt cancelled f/u for a variety of reasons and didn't respond to attempt by counselor to f/u 06/2014.  Jenne Campus, LPC

## 2015-04-10 ENCOUNTER — Emergency Department (HOSPITAL_BASED_OUTPATIENT_CLINIC_OR_DEPARTMENT_OTHER): Payer: 59

## 2015-04-10 ENCOUNTER — Emergency Department (HOSPITAL_BASED_OUTPATIENT_CLINIC_OR_DEPARTMENT_OTHER)
Admission: EM | Admit: 2015-04-10 | Discharge: 2015-04-10 | Disposition: A | Discharge status: Home / Self Care | Payer: 59 | Attending: Emergency Medicine | Admitting: Emergency Medicine

## 2015-04-10 ENCOUNTER — Encounter (HOSPITAL_BASED_OUTPATIENT_CLINIC_OR_DEPARTMENT_OTHER): Payer: Self-pay | Admitting: Emergency Medicine

## 2015-04-10 DIAGNOSIS — Z79899 Other long term (current) drug therapy: Secondary | ICD-10-CM | POA: Insufficient documentation

## 2015-04-10 DIAGNOSIS — W228XXA Striking against or struck by other objects, initial encounter: Secondary | ICD-10-CM | POA: Diagnosis not present

## 2015-04-10 DIAGNOSIS — Y9289 Other specified places as the place of occurrence of the external cause: Secondary | ICD-10-CM | POA: Diagnosis not present

## 2015-04-10 DIAGNOSIS — S6992XA Unspecified injury of left wrist, hand and finger(s), initial encounter: Secondary | ICD-10-CM | POA: Diagnosis present

## 2015-04-10 DIAGNOSIS — E663 Overweight: Secondary | ICD-10-CM | POA: Diagnosis not present

## 2015-04-10 DIAGNOSIS — K219 Gastro-esophageal reflux disease without esophagitis: Secondary | ICD-10-CM | POA: Insufficient documentation

## 2015-04-10 DIAGNOSIS — E785 Hyperlipidemia, unspecified: Secondary | ICD-10-CM | POA: Insufficient documentation

## 2015-04-10 DIAGNOSIS — Z87442 Personal history of urinary calculi: Secondary | ICD-10-CM | POA: Diagnosis not present

## 2015-04-10 DIAGNOSIS — Y9389 Activity, other specified: Secondary | ICD-10-CM | POA: Insufficient documentation

## 2015-04-10 DIAGNOSIS — G43909 Migraine, unspecified, not intractable, without status migrainosus: Secondary | ICD-10-CM | POA: Insufficient documentation

## 2015-04-10 DIAGNOSIS — Y998 Other external cause status: Secondary | ICD-10-CM | POA: Diagnosis not present

## 2015-04-10 DIAGNOSIS — E119 Type 2 diabetes mellitus without complications: Secondary | ICD-10-CM | POA: Insufficient documentation

## 2015-04-10 DIAGNOSIS — M79642 Pain in left hand: Secondary | ICD-10-CM

## 2015-04-10 MED ORDER — OXYCODONE-ACETAMINOPHEN 5-325 MG PO TABS
1.0000 | ORAL_TABLET | Freq: Once | ORAL | Status: AC
  Administered 2015-04-10: 1 via ORAL
  Filled 2015-04-10: qty 1

## 2015-04-10 NOTE — ED Provider Notes (Signed)
CSN: [id number]     Arrival date & time 04/10/15  1954 History  By signing my name below, I, Stephania Fragmin, attest that this documentation has been prepared under the direction and in the presence of Merrily Pew, MD. Electronically Signed: Stephania Fragmin, ED Scribe. 04/10/2015. 10:18 PM.   Chief Complaint  Patient presents with  . Hand Pain   The history is provided by the patient. No language interpreter was used.    HPI Comments: Angela Franklin is a 48 y.o. female with a history of DM, who presents to the Emergency Department complaining of left hand pain and swelling after hitting the dorsum of her left hand on a dresser 4 hours PTA. She complains of worsening swelling even after using an ice pack, so she came to the ED for evaluation. She states has pain has improved since being here, but her swelling has worsened. She denies any injuries anywhere else. She denies any history of the same. Movement exacerbates her pain, although she reports FROM.   Past Medical History  Diagnosis Date  . GERD (gastroesophageal reflux disease)   . Seasonal allergies   . Hyperlipidemia   . History of kidney stones     no surgery - passed stone  . Heartburn   . Overweight(278.02)   . Headache(784.0)     tx for migraines since 2009  . Diabetes mellitus without complication [facility])    Past Surgical History  Procedure Laterality Date  . Right ankle  1976    surgery   . Right foot  04/2011    surgery - plantar facitis - hammer toe  . Temporomandibular joint surgery  1982    right disc replacement  . Wisdom tooth extraction    . Breast surgery      left breast biopsy - benign  . Spinal fusion lower back  2000    L3-5  . Laparoscopic assisted vaginal hysterectomy Bilateral 05/14/2013    Procedure: LAPAROSCOPIC ASSISTED VAGINAL HYSTERECTOMY , BILATERAL SALPINGECTOMY;  Surgeon: Thurnell Lose, MD;  Location: [facility];  Service: Gynecology;  Laterality: Bilateral;   Family History  Problem Relation Age of  Onset  . Hyperlipidemia Mother   . Heart disease Mother     pericarditis  . Hypertension Father   . Diabetes Father   . Alcohol abuse Brother   . Migraines Maternal Aunt   . Alcohol abuse Maternal Uncle   . Alcohol abuse Paternal Grandfather   . Alcohol abuse Paternal Grandmother    Social History  Substance Use Topics  . Smoking status: Never Smoker   . Smokeless tobacco: Never Used  . Alcohol Use: Yes     Comment: occasional   OB History    No data available     Review of Systems  Musculoskeletal: Positive for myalgias (left hand pain).  All other systems reviewed and are negative.  Allergies  Codeine; Dilaudid; Hydromorphone hcl; and Niacin and related  Home Medications   Prior to Admission medications   Medication Sig Start Date End Date Taking? Authorizing Provider  Diclofenac Potassium (CAMBIA) 50 MG PACK Take 50 mg by mouth as needed.    Historical Provider, MD  fluticasone (VERAMYST) 27.5 MCG/SPRAY nasal spray Place 2 sprays into the nose daily as needed for rhinitis.    Historical Provider, MD  imipramine (TOFRANIL) 50 MG tablet Take 50 mg by mouth at bedtime.    Historical Provider, MD  levocetirizine (XYZAL) 5 MG tablet Take 5 mg by mouth daily.  Historical Provider, MD  magnesium gluconate (MAGONATE) 500 MG tablet Take 250 mg by mouth daily.    Historical Provider, MD  Melatonin 3 MG CAPS Take by mouth.    Historical Provider, MD  metoCLOPramide (REGLAN) 10 MG tablet Take 10 mg by mouth every 6 (six) hours as needed for nausea.    Historical Provider, MD  omeprazole (PRILOSEC) 20 MG capsule Take 20 mg by mouth daily.    Historical Provider, MD  PARoxetine Mesylate (BRISDELLE PO) Take by mouth.    Historical Provider, MD  Probiotic Product (ALIGN PO) Take 1 tablet by mouth daily.    Historical Provider, MD  rizatriptan (MAXALT) 10 MG tablet Take 10 mg by mouth as needed for migraine. May repeat in 2 hours if needed    Historical Provider, MD  Rosuvastatin  Calcium (CRESTOR PO) Take by mouth.    Historical Provider, MD  traMADol (ULTRAM) 50 MG tablet 1 tablet every 6 hours prn pain.  May take 2 tabs every 6 hours for moderate to severe pain. Patient not taking: Reported on 11/10/2014 05/15/13   Thurnell Lose, MD  vitamin B-12 (CYANOCOBALAMIN) 1000 MCG tablet Take 2,500 mcg by mouth daily.    Historical Provider, MD   BP 115/74 mmHg  Pulse 87  Temp(Src) 97.7 F (36.5 C) (Oral)  Resp 16  Ht 5\' 4"  (1.626 m)  Wt 168 lb (76.204 kg)  BMI 28.82 kg/m2  SpO2 100% Physical Exam  Constitutional: She is oriented to person, place, and time. She appears well-developed and well-nourished. No distress.  HENT:  Head: Normocephalic and atraumatic.  Eyes: Conjunctivae and EOM are normal.  Neck: Neck supple. No tracheal deviation present.  Cardiovascular: Normal rate.   Pulmonary/Chest: Effort normal. No respiratory distress.  Musculoskeletal: Normal range of motion.  LUE: FROM. Normal doorway exam. No TTP to lateral compression of metacarpals. Able to make a fist with left hand. Normal radial pulse (2+) and sensation intact.  No evidence of trauma elsewhere   Neurological: She is alert and oriented to person, place, and time.  Skin: Skin is warm and dry.  Psychiatric: She has a normal mood and affect. Her behavior is normal.  Nursing note and vitals reviewed.   ED Course  Procedures (including critical care time)  DIAGNOSTIC STUDIES: Oxygen Saturation is 100% on RA, normal by my interpretation.    COORDINATION OF CARE: 9:58 PM - Discussed with neg XR with pt. F/u with PCP in 1 week for persistent or worsening symptoms. Pt verbalized understanding and agreed to plan.   Imaging Review Dg Hand Complete Left  04/10/2015  CLINICAL DATA:  Hit hand on the corner of furniture tonight with bruising and swelling posterior hand EXAM: LEFT HAND - COMPLETE 3+ VIEW COMPARISON:  None. FINDINGS: There is no evidence of fracture or dislocation. There is no  evidence of arthropathy or other focal bone abnormality. Soft tissues are unremarkable. IMPRESSION: Negative. Electronically Signed   By: Skipper Cliche M.D.   On: 04/10/2015 20:27   I have personally reviewed and evaluated these images and lab results as part of my medical decision-making.  MDM   Final diagnoses:  Pain of left hand   49 year old female hit her hand on a dresser with swelling to the dorsum of her hand. No pain with lateral compression of her metacarpals full range of motion of her hands normal sensation all capillary refill. I doubt that the patient has broken bone with negative x-rays. We'll treat symptomatically with PCP follow-up in  a week if not improving. Discussed with her the possibility of missed fractures and that is the need for PCP follow-up if her pain persists.  I have personally and contemperaneously reviewed labs and imaging and used in my decision making as above.   A medical screening exam was performed and I feel the patient has had an appropriate workup for their chief complaint at this time and likelihood of emergent condition existing is low. They have been counseled on decision, discharge, follow up and which symptoms necessitate immediate return to the emergency department. They or their family verbally stated understanding and agreement with plan and discharged in stable condition.   I personally performed the services described in this documentation, which was scribed in my presence. The recorded information has been reviewed and is accurate.     Merrily Pew, MD 04/10/15 [phone]

## 2015-04-10 NOTE — ED Notes (Signed)
Pt in with injury to L hand, hit it on the corner of a dresser.

## 2016-01-27 ENCOUNTER — Encounter: Payer: Self-pay | Admitting: Internal Medicine

## 2016-02-17 ENCOUNTER — Encounter

## 2016-05-17 ENCOUNTER — Ambulatory Visit

## 2016-05-17 ENCOUNTER — Inpatient Hospital Stay: Admit: 2016-05-17 | Payer: BLUE CROSS/BLUE SHIELD | Primary: Family

## 2016-05-17 DIAGNOSIS — Z1231 Encounter for screening mammogram for malignant neoplasm of breast: Secondary | ICD-10-CM

## 2016-05-25 ENCOUNTER — Ambulatory Visit (INDEPENDENT_AMBULATORY_CARE_PROVIDER_SITE_OTHER): Payer: BLUE CROSS/BLUE SHIELD

## 2016-05-25 ENCOUNTER — Ambulatory Visit (INDEPENDENT_AMBULATORY_CARE_PROVIDER_SITE_OTHER): Payer: BLUE CROSS/BLUE SHIELD | Admitting: Podiatry

## 2016-05-25 ENCOUNTER — Encounter: Payer: Self-pay | Admitting: Podiatry

## 2016-05-25 DIAGNOSIS — M7750 Other enthesopathy of unspecified foot: Secondary | ICD-10-CM | POA: Diagnosis not present

## 2016-05-25 MED ORDER — TRIAMCINOLONE ACETONIDE 10 MG/ML IJ SUSP
10.0000 mg | Freq: Once | INTRAMUSCULAR | Status: AC
  Administered 2016-05-25: 10 mg

## 2016-05-25 NOTE — Progress Notes (Signed)
Subjective:     Patient ID: Angela Franklin, female   DOB: 07-20-1965, 50 y.o.   MRN: [id number]  HPI patient presents stating I developed pain in the inside of my right foot that I think I had last year   Review of Systems     Objective:   Physical Exam Neurovascular status intact with discomfort in the anterior tibial tendon group right at the insertion with no indication of tendon dysfunction    Assessment:     Tendinitis right with pain    Plan:     Careful sheath injection administered 3 mg Kenalog 5 mg Xylocaine advised on brace usage ice therapy and supportive shoes and reappoint as needed

## 2016-06-11 HISTORY — PX: ROTATOR CUFF REPAIR: SHX139

## 2016-10-18 ENCOUNTER — Encounter: Payer: Self-pay | Admitting: Podiatry

## 2016-10-18 ENCOUNTER — Ambulatory Visit (INDEPENDENT_AMBULATORY_CARE_PROVIDER_SITE_OTHER): Payer: 59 | Admitting: Podiatry

## 2016-10-18 DIAGNOSIS — M7750 Other enthesopathy of unspecified foot: Secondary | ICD-10-CM | POA: Diagnosis not present

## 2016-10-18 NOTE — Progress Notes (Signed)
Subjective:    Patient ID: Arvin Collard, female   DOB: 51 y.o.   MRN: [id number]   HPI patient presents stating that the tendon continues to be bothersome and a not as severe way but present. States that the inflammation can make it difficult at times for gait and she feels like she doesn't have enough support    ROS      Objective:  Physical Exam Neurovascular status intact muscle strength adequate with patient's anterior tibial tendon functioning well but does have inflammation around the insertion into the medial cuneiform    Assessment:     Anterior tibial tendinitis right    Plan:    Reviewed condition and recommended long-term orthotics to support the plantar and mid arch and explained procedure and explained the type we would make for this patient

## 2016-11-13 ENCOUNTER — Inpatient Hospital Stay: Primary: Family

## 2016-11-14 NOTE — Other (Signed)
Patient verified name, DOB, and surgery as listed in [facility].     Type 1B surgery, phone assessment complete.  Orders were received.    Labs per surgeon: none  Labs per anesthesia protocol: none  Mo recent ekg, patient denies any cardiac hx      Patient answered medical/surgical history questions at their best of ability. All prior to admission medications documented in [facility].    Patient instructed to take the following medications the day of surgery according to anesthesia guidelines with a small sip of water: pravastatin and omeprazole .  Hold all vitamins 7 days prior to surgery and NSAIDS 5 days prior to surgery. Medications to be held contrave on am of surgery    Patient instructed on the following:  [facility], time of arrival to be called the day before by 1700  NPO after midnight including gum, mints, and ice chips  Responsible adult must drive patient to the hospital, stay during surgery, and patient will  need supervision 24 hours after anesthesia  Use soap in shower the night before surgery and on the morning of surgery  Leave all valuables (money and jewelry) at home but bring insurance card and ID on       DOS  Do not wear make-up, nail polish, lotions, cologne, perfumes, powders, or oil on skin.    Patient teach back successful and patient demonstrates knowledge of instruction.

## 2016-11-15 ENCOUNTER — Encounter: Payer: Self-pay | Admitting: Orthotics

## 2016-11-16 ENCOUNTER — Inpatient Hospital Stay: Payer: PRIVATE HEALTH INSURANCE

## 2016-11-16 MED ORDER — BUPIVACAINE-EPINEPHRINE (PF) 0.25 %-1:200,000 IJ SOLN
0.25 %-1:200,000 | INTRAMUSCULAR | Status: DC | PRN
Start: 2016-11-16 — End: 2016-11-16
  Administered 2016-11-16: 11:00:00 via PERINEURAL

## 2016-11-16 MED ORDER — MIDAZOLAM 1 MG/ML IJ SOLN
1 mg/mL | Freq: Once | INTRAMUSCULAR | Status: DC | PRN
Start: 2016-11-16 — End: 2016-11-16

## 2016-11-16 MED ORDER — CEFAZOLIN 2 GRAM/20 ML IN STERILE WATER INTRAVENOUS SYRINGE
2 gram/0 mL | Freq: Once | INTRAVENOUS | Status: AC
Start: 2016-11-16 — End: 2016-11-16
  Administered 2016-11-16: 12:00:00 via INTRAVENOUS

## 2016-11-16 MED ORDER — FENTANYL CITRATE (PF) 50 MCG/ML IJ SOLN
50 mcg/mL | Freq: Once | INTRAMUSCULAR | Status: DC
Start: 2016-11-16 — End: 2016-11-16

## 2016-11-16 MED ORDER — LACTATED RINGERS IV
INTRAVENOUS | Status: DC
Start: 2016-11-16 — End: 2016-11-16
  Administered 2016-11-16 (×2): via INTRAVENOUS

## 2016-11-16 MED ORDER — FENTANYL CITRATE (PF) 50 MCG/ML IJ SOLN
50 mcg/mL | INTRAMUSCULAR | Status: AC
Start: 2016-11-16 — End: ?

## 2016-11-16 MED ORDER — MIDAZOLAM 1 MG/ML IJ SOLN
1 mg/mL | Freq: Once | INTRAMUSCULAR | Status: AC
Start: 2016-11-16 — End: 2016-11-16
  Administered 2016-11-16: 11:00:00 via INTRAVENOUS

## 2016-11-16 MED ORDER — EPINEPHRINE (PF) 1 MG/ML INJECTION
1 mg/mL ( mL) | INTRAMUSCULAR | Status: DC | PRN
Start: 2016-11-16 — End: 2016-11-16
  Administered 2016-11-16: 13:00:00

## 2016-11-16 MED ORDER — ALBUTEROL SULFATE 0.083 % (0.83 MG/ML) SOLN FOR INHALATION
2.5 mg /3 mL (0.083 %) | RESPIRATORY_TRACT | Status: DC | PRN
Start: 2016-11-16 — End: 2016-11-16

## 2016-11-16 MED ORDER — BUPIVACAINE-EPINEPHRINE (PF) 0.5 %-1:200,000 IJ SOLN
0.5 %-1:200,000 | INTRAMUSCULAR | Status: DC | PRN
Start: 2016-11-16 — End: 2016-11-16
  Administered 2016-11-16: 11:00:00 via PERINEURAL

## 2016-11-16 MED ORDER — NALOXONE 0.4 MG/ML INJECTION
0.4 mg/mL | INTRAMUSCULAR | Status: DC | PRN
Start: 2016-11-16 — End: 2016-11-16

## 2016-11-16 MED ORDER — EPHEDRINE SULFATE 50 MG/ML INJECTION SOLUTION
50 mg/mL | INTRAMUSCULAR | Status: DC | PRN
Start: 2016-11-16 — End: 2016-11-16
  Administered 2016-11-16 (×2): via INTRAVENOUS

## 2016-11-16 MED ORDER — OXYCODONE 5 MG TAB
5 mg | Freq: Once | ORAL | Status: DC | PRN
Start: 2016-11-16 — End: 2016-11-16

## 2016-11-16 MED ORDER — ONDANSETRON (PF) 4 MG/2 ML INJECTION
4 mg/2 mL | INTRAMUSCULAR | Status: DC | PRN
Start: 2016-11-16 — End: 2016-11-16
  Administered 2016-11-16: 12:00:00 via INTRAVENOUS

## 2016-11-16 MED ORDER — DEXAMETHASONE SODIUM PHOSPHATE 4 MG/ML IJ SOLN
4 mg/mL | INTRAMUSCULAR | Status: DC | PRN
Start: 2016-11-16 — End: 2016-11-16
  Administered 2016-11-16: 12:00:00 via INTRAVENOUS

## 2016-11-16 MED ORDER — DIPHENHYDRAMINE HCL 50 MG/ML IJ SOLN
50 mg/mL | Freq: Once | INTRAMUSCULAR | Status: DC | PRN
Start: 2016-11-16 — End: 2016-11-16

## 2016-11-16 MED ORDER — HYDROMORPHONE (PF) 2 MG/ML IJ SOLN
2 mg/mL | INTRAMUSCULAR | Status: DC | PRN
Start: 2016-11-16 — End: 2016-11-16

## 2016-11-16 MED ORDER — LACTATED RINGERS IV
INTRAVENOUS | Status: DC
Start: 2016-11-16 — End: 2016-11-16

## 2016-11-16 MED ORDER — EPINEPHRINE (PF) 1 MG/ML INJECTION
1 mg/mL ( mL) | INTRAMUSCULAR | Status: AC
Start: 2016-11-16 — End: ?

## 2016-11-16 MED ORDER — ONDANSETRON (PF) 4 MG/2 ML INJECTION
4 mg/2 mL | Freq: Once | INTRAMUSCULAR | Status: DC
Start: 2016-11-16 — End: 2016-11-16

## 2016-11-16 MED ORDER — PROPOFOL 10 MG/ML IV EMUL
10 mg/mL | INTRAVENOUS | Status: DC | PRN
Start: 2016-11-16 — End: 2016-11-16
  Administered 2016-11-16: 12:00:00 via INTRAVENOUS

## 2016-11-16 MED ORDER — LIDOCAINE HCL 1 % (10 MG/ML) IJ SOLN
10 mg/mL (1 %) | INTRAMUSCULAR | Status: DC | PRN
Start: 2016-11-16 — End: 2016-11-16

## 2016-11-16 MED ORDER — LIDOCAINE (PF) 20 MG/ML (2 %) IJ SOLN
20 mg/mL (2 %) | INTRAMUSCULAR | Status: DC | PRN
Start: 2016-11-16 — End: 2016-11-16
  Administered 2016-11-16: 12:00:00 via INTRAVENOUS

## 2016-11-16 MED FILL — ONDANSETRON (PF) 4 MG/2 ML INJECTION: 4 mg/2 mL | INTRAMUSCULAR | Qty: 4

## 2016-11-16 MED FILL — BUPIVACAINE-EPINEPHRINE (PF) 0.5 %-1:200,000 IJ SOLN: 0.5 %-1:200,000 | INTRAMUSCULAR | Qty: 15

## 2016-11-16 MED FILL — XYLOCAINE-MPF 20 MG/ML (2 %) INJECTION SOLUTION: 20 mg/mL (2 %) | INTRAMUSCULAR | Qty: 100

## 2016-11-16 MED FILL — EPINEPHRINE (PF) 1 MG/ML INJECTION: 1 mg/mL ( mL) | INTRAMUSCULAR | Qty: 2

## 2016-11-16 MED FILL — MIDAZOLAM 1 MG/ML IJ SOLN: 1 mg/mL | INTRAMUSCULAR | Qty: 2

## 2016-11-16 MED FILL — EPHEDRINE SULFATE 50 MG/ML IJ SOLN: 50 mg/mL | INTRAMUSCULAR | Qty: 10

## 2016-11-16 MED FILL — PROPOFOL 10 MG/ML IV EMUL: 10 mg/mL | INTRAVENOUS | Qty: 15

## 2016-11-16 MED FILL — DEXAMETHASONE SODIUM PHOSPHATE 10 MG/ML IJ SOLN: 10 mg/mL | INTRAMUSCULAR | Qty: 5

## 2016-11-16 MED FILL — SENSORCAINE-MPF/EPINEPHRINE 0.25 %-1:200,000 INJECTION SOLUTION: 0.25 %-1:200,000 | INTRAMUSCULAR | Qty: 15

## 2016-11-16 MED FILL — FENTANYL CITRATE (PF) 50 MCG/ML IJ SOLN: 50 mcg/mL | INTRAMUSCULAR | Qty: 2

## 2016-11-16 MED FILL — CEFAZOLIN 2 GRAM/20 ML IN STERILE WATER INTRAVENOUS SYRINGE: 2 gram/0 mL | INTRAVENOUS | Qty: 20

## 2016-11-16 NOTE — Anesthesia Post-Procedure Evaluation (Signed)
Post-Anesthesia Evaluation and Assessment    Patient: Nicole [name] MRN: [id number]  SSN: [id number]    Date of Birth: Oct 31, 1965  Age: [age] y.o.  Sex: female       Cardiovascular Function/Vital Signs  Visit Vitals   ??? BP 140/82   ??? Pulse 70   ??? Temp 36.3 ??C (97.4 ??F)   ??? Resp 14   ??? Wt 76.2 kg (168 lb)   ??? SpO2 94%   ??? BMI 28.84 kg/m2       Patient is status post general anesthesia for Procedure(s):  LEFT SHOULDER ARTHROSCOPY/ DECOMPRESSION/  DISTAL CLAVICLE RESECTION   GEN/SCAL.    Nausea/Vomiting: None    Postoperative hydration reviewed and adequate.    Pain:  Pain Scale 1: Numeric (0 - 10) (11/16/16 0941)  Pain Intensity 1: 0 (11/16/16 0941)   Managed    Neurological Status:   Neuro (WDL): Exceptions to WDL (11/16/16 0941)  Neuro  Neurologic State: Alert (11/16/16 0941)  LUE Motor Response: Numbness;Pharmacologically paralyzed (11/16/16 0941)  LLE Motor Response: Purposeful (11/16/16 0941)  RUE Motor Response: Purposeful (11/16/16 0941)  RLE Motor Response: Purposeful (11/16/16 0941)   At baseline    Mental Status and Level of Consciousness: Arousable    Pulmonary Status:   O2 Device: Room air (11/16/16 0941)   Adequate oxygenation and airway patent    Complications related to anesthesia: None    Post-anesthesia assessment completed. No concerns    Signed By: Waymond CeraJohn D Rayaan Garguilo, MD     November 16, 2016

## 2016-11-16 NOTE — H&P (Signed)
Outpatient Surgery [facility] and Physical[name]    CHIEF COMPLAINT:    l shoulder .     PE:     Visit Vitals   ??? BP 132/73 (BP 1 Location: Right arm, BP Patient Position: Supine)   ??? Pulse 67   ??? Temp 97.6 ??F (36.4 ??C)   ??? Resp 18   ??? Wt 76.2 kg (168 lb)   ??? SpO2 98%   ??? BMI 28.84 kg/m2       Heart:   Regular rhythm      Lungs:  Are clear      Past Medical History:  There are no active problems to display for this patient.      Surgical History:   Past Surgical History:   Procedure Laterality Date   ??? ABDOMEN SURGERY PROC UNLISTED  2016    tummy tuck   ??? HX BREAST BIOPSY Left     benign   ??? HX HEENT Right     TMJ   ??? HX HYSTERECTOMY  2015   ??? HX LUMBAR FUSION  2000   ??? HX ORTHOPAEDIC Right 1976    ankle fx with hardware   ??? HX ORTHOPAEDIC Right     right plantar fascitis       Social History: Patient  reports that she has never smoked. She has never used smokeless tobacco. She reports that she drinks alcohol. She reports that she does not use illicit drugs.    Family History:   Family History   Problem Relation Age of Onset   ??? Diabetes Father    ??? Cancer Father      lip ca       Allergies: Reviewed per EMR  Allergies   Allergen Reactions   ??? Codeine Nausea and Vomiting   ??? Dilaudid [Hydromorphone] Nausea and Vomiting   ??? Niacin Itching     severe       Medications:    No current facility-administered medications on file prior to encounter.      No current outpatient prescriptions on file prior to encounter.       The surgery is planned for the  Shoulder .        History and physical has been reviewed. The patient has been examined. There have been no significant clinical changes since the completion of the originally dated History and Physical.  Patient identified by surgeon; surgical site was confirmed by patient and surgeon.      The patient is here today for outpatient surgery. I have examined the patient, no changes are noted in the patient's medical status. Necessity  for the procedure/care is still present and the history and physical above is current.  See the office notes for the full long term history of the problem.  Please see the recent office notes for the musculoskeletal examination.    Signed By: Brock RaJohn R Alwin Lanigan, MD     November 16, 2016 7:05 AM

## 2016-11-16 NOTE — Anesthesia Pre-Procedure Evaluation (Signed)
Anesthetic History               Review of Systems / Medical History  Patient summary reviewed, nursing notes reviewed and pertinent labs reviewed    Pulmonary                   Neuro/Psych              Cardiovascular                  Exercise tolerance: >4 METS     GI/Hepatic/Renal                Endo/Other             Other Findings              Physical Exam    Airway  Mallampati: II  TM Distance: 4 - 6 cm  Neck ROM: normal range of motion   Mouth opening: Normal     Cardiovascular  Regular rate and rhythm,  S1 and S2 normal,  no murmur, click, rub, or gallop             Dental  No notable dental hx       Pulmonary  Breath sounds clear to auscultation               Abdominal         Other Findings            Anesthetic Plan    ASA: 2  Anesthesia type: general      Post-op pain plan if not by surgeon: peripheral nerve block single    Induction: Intravenous  Anesthetic plan and risks discussed with: Patient

## 2016-11-16 NOTE — Brief Op Note (Signed)
BRIEF OPERATIVE NOTE    Date of Procedure: 11/16/2016   Preoperative Diagnosis: Subacromial impingement of left shoulder [M75.42]  Tendonitis of left rotator cuff [M75.82]  Postoperative Diagnosis: Left shoulder impingement/ AC arthritis    Procedure(s):  LEFT SHOULDER ARTHROSCOPY DISTAL CLAVICLE RESECTION DEBRIDEMENT ROTATOR CUFF   GEN/SCAL  Surgeon(s) and Role:     * Brock RaJohn R Calynn Ferrero, MD - Primary         Surgical Assistant:       Surgical Staff:  Circ-1: Inocente Sallesharla T Candler, RN  Scrub Tech-1: Bradly BienenstockJennifer A Neely  Scrub Tech-2: Wayne BothJason Darnell  Event Time In   Incision Start [phone]   Incision Close      Anesthesia: General   Estimated Blood Loss:     Specimens: * No specimens in log *   Findings:      Complications:     Implants: * No implants in log *

## 2016-11-16 NOTE — Anesthesia Procedure Notes (Signed)
Peripheral Block    Start time: 11/16/2016 7:24 AM  End time: 11/16/2016 7:26 AM  Performed by: Janeen Watson D  Authorized by: Gayanne Prescott D       Pre-procedure:   Indications: at surgeon's request and post-op pain management    Preanesthetic Checklist: patient identified, risks and benefits discussed, site marked, timeout performed, anesthesia consent given and patient being monitored    Timeout Time: 07:24          Block Type:   Block Type:  Interscalene  Laterality:  Left  Monitoring:  Standard ASA monitoring, continuous pulse ox, frequent vital sign checks, heart rate, oxygen and responsive to questions  Injection Technique:  Single shot  Procedures: ultrasound guided    Patient Position: supine  Prep: chlorhexidine    Location:  Interscalene  Needle Type:  Stimuplex  Needle Gauge:  21 G  Needle Localization:  Ultrasound guidance and anatomical landmarks  Medication Injected:  0.5%  bupivacaine  Adds:  Epi 1:200K  Volume (mL):  15  Add'l Medication Injected:  0.25%  bupivacaine  Volume (mL):  15    Assessment:  Number of attempts:  1  Injection Assessment:  Incremental injection every 5 mL, local visualized surrounding nerve on ultrasound, negative aspiration for blood, no paresthesia, no intravascular symptoms and ultrasound image on chart  Patient tolerance:  Patient tolerated the procedure well with no immediate complications

## 2016-11-16 NOTE — Op Note (Signed)
[facility]  OPERATIVE [facility]    Name:[name], Nicole CounterMARY [place]  MR#: [id number]  DOB: 1965/07/06  ACCOUNT #: [id number]   DATE OF SERVICE: 11/16/2016    PREOPERATIVE DIAGNOSES:  1.  Impingement of the left shoulder.  2.  Acromioclavicular joint arthritis.    POSTOPERATIVE DIAGNOSES:  1.  Small degenerative labral tear.  2.  Small bursal tear of the rotator cuff.  3.  Acromioclavicular joint arthritis.  4.  Impingement of the left shoulder.    PROCEDURES PERFORMED:  1.  Debridement of degenerative labral tear.  2.  Debridement of small bursal tear of the rotator cuff.  3.  Resection of distal clavicle.  4.  Subacromial decompression of the left shoulder.    SURGEON:  Tyrone SageJohn Ed Mandich, MD     ASSISTANT:  None.    ANESTHESIA:  General.    ESTIMATED BLOOD LOSS:  Minimal.    SPECIMENS REMOVED:  None.    COMPLICATIONS:  None.    IMPLANTS:  None.    DESCRIPTION OF PROCEDURE:  After an adequate level of general anesthesia was obtained, the left shoulder was prepped and draped in the usual sterile fashion.  She had full range of motion of the shoulder.  Block per Anesthesia for postop pain management, she received antibiotics.  The joint was distended posteriorly with saline and arthroscope introduced.  The articular surface looked good.  The biceps was intact.  Photos made for the patient.  There was some degeneration of the labrum anteriorly, but no evidence of any instability.  She had a normal variant at about the 10 o'clock position.  There is nothing to suggest any instability and some of the degeneration was debrided.  There was no superior labral tear that warranted a repair.  The arthroscope was placed in the subacromial space and the patient had some hooking of the acromion, and decompression performed with a shaver and the bur.  She had some bursal tearing of the rotator cuff, but overall excellent integrity.  She had some degenerative  changes noted on the end of the distal clavicle, some of the inferior osteophytes were removed, after circumferentially removing the soft tissue.  A bur was used from the lateral portal to get the inferior spurs, and then through the anterior portal with pressure applied superiorly, the resection was extended more superiorly and posteriorly.  The wound was then copiously irrigated.  Sterile dressing was applied.      Brock RaJOHN R. Jireh Vinas, MD       JRV / DN  D: 11/16/2016 09:05     T: 11/16/2016 09:23  JOB #: [id number]  CC: Tyrone SageJOHN Menashe Kafer MD

## 2016-11-16 NOTE — Op Note (Signed)
[facility]  OPERATIVE REPORT    Name:Nicole [name], Nicole [name]  MR#: [id number]  DOB: [birth year]  ACCOUNT #: [id number]   DATE OF SERVICE: 11/16/2016    PREOPERATIVE DIAGNOSES:  1.  Impingement of the left shoulder.  2.  Acromioclavicular joint arthritis.    POSTOPERATIVE DIAGNOSES:  1.  Small degenerative labral tear.  2.  Small bursal tear of the rotator cuff.  3.  Acromioclavicular joint arthritis.  4.  Impingement of the left shoulder.    PROCEDURES PERFORMED:  1.  Debridement of degenerative labral tear.  2.  Debridement of small bursal tear of the rotator cuff.  3.  Resection of distal clavicle.  4.  Subacromial decompression of the left shoulder.    SURGEON:  Tyrone SageJohn Jannelle Notaro, MD     ASSISTANT:  None.    ANESTHESIA:  General.    ESTIMATED BLOOD LOSS:  Minimal.    SPECIMENS REMOVED:  None.    COMPLICATIONS:  None.    IMPLANTS:  None.    DESCRIPTION OF PROCEDURE:  After an adequate level of general anesthesia was obtained, the left shoulder was prepped and draped in the usual sterile fashion.  She had full range of motion of the shoulder.  Block per Anesthesia for postop pain management, she received antibiotics.  The joint was distended posteriorly with saline and arthroscope introduced.  The articular surface looked good.  The biceps was intact.  Photos made for the patient.  There was some degeneration of the labrum anteriorly, but no evidence of any instability.  She had a normal variant at about the 10 o'clock position.  There is nothing to suggest any instability and some of the degeneration was debrided.  There was no superior labral tear that warranted a repair.  The arthroscope was placed in the subacromial space and the patient had some hooking of the acromion, and decompression performed with a shaver and the bur.  She had some bursal tearing of the rotator cuff, but overall excellent integrity.  She had some degenerative changes noted on the end of the distal clavicle, some of  the inferior osteophytes were removed, after circumferentially removing the soft tissue.  A bur was used from the lateral portal to get the inferior spurs, and then through the anterior portal with pressure applied superiorly, the resection was extended more superiorly and posteriorly.  The wound was then copiously irrigated.  Sterile dressing was applied.      Brock RaJOHN R. Kameren Pargas, MD       JRV / DN  D: 11/16/2016 09:05     T: 11/16/2016 09:23  JOB #: [id number]  CC: Tyrone SageJOHN Jayel Scaduto MD

## 2016-11-16 NOTE — Anesthesia Procedure Notes (Signed)
Peripheral Block    Start time: 11/16/2016 7:24 AM  End time: 11/16/2016 7:26 AM  Performed by: Waymond CeraSHERBERT, Matie Dimaano D  Authorized by: Oletta DarterSHERBERT, Kashawn Manzano D       Pre-procedure:   Indications: at surgeon's request and post-op pain management    Preanesthetic Checklist: patient identified, risks and benefits discussed, site marked, timeout performed, anesthesia consent given and patient being monitored    Timeout Time: 07:24          Block Type:   Block Type:  Interscalene  Laterality:  Left  Monitoring:  Standard ASA monitoring, continuous pulse ox, frequent vital sign checks, heart rate, oxygen and responsive to questions  Injection Technique:  Single shot  Procedures: ultrasound guided    Patient Position: supine  Prep: chlorhexidine    Location:  Interscalene  Needle Type:  Stimuplex  Needle Gauge:  21 G  Needle Localization:  Ultrasound guidance and anatomical landmarks  Medication Injected:  0.5%  bupivacaine  Adds:  Epi 1:200K  Volume (mL):  15  Add'l Medication Injected:  0.25%  bupivacaine  Volume (mL):  15    Assessment:  Number of attempts:  1  Injection Assessment:  Incremental injection every 5 mL, local visualized surrounding nerve on ultrasound, negative aspiration for blood, no paresthesia, no intravascular symptoms and ultrasound image on chart  Patient tolerance:  Patient tolerated the procedure well with no immediate complications

## 2016-11-19 ENCOUNTER — Encounter: Payer: Self-pay | Admitting: Orthotics

## 2016-12-10 ENCOUNTER — Encounter: Payer: 59 | Admitting: Orthotics

## 2019-07-27 ENCOUNTER — Ambulatory Visit (INDEPENDENT_AMBULATORY_CARE_PROVIDER_SITE_OTHER): Payer: BLUE CROSS/BLUE SHIELD

## 2019-07-27 ENCOUNTER — Other Ambulatory Visit: Payer: Self-pay

## 2019-07-27 ENCOUNTER — Ambulatory Visit (INDEPENDENT_AMBULATORY_CARE_PROVIDER_SITE_OTHER): Payer: BLUE CROSS/BLUE SHIELD | Admitting: Podiatry

## 2019-07-27 ENCOUNTER — Other Ambulatory Visit: Payer: Self-pay | Admitting: Podiatry

## 2019-07-27 ENCOUNTER — Encounter: Payer: Self-pay | Admitting: Podiatry

## 2019-07-27 VITALS — Temp 97.1°F

## 2019-07-27 DIAGNOSIS — I7300 Raynaud's syndrome without gangrene: Secondary | ICD-10-CM

## 2019-07-27 DIAGNOSIS — M79671 Pain in right foot: Secondary | ICD-10-CM | POA: Diagnosis not present

## 2019-07-27 NOTE — Progress Notes (Signed)
Subjective:   Patient ID: Angela Franklin, female   DOB: 54 y.o.   MRN: [id number]   HPI Patient presents stating that on getting some hot feeling at the end of my second toe right and its been going on for around 5 months.  Patient does not remember specifically something happening but states that this does seem to be bothering her quite a bit and she is also concerned about some left foot plantar medial itching and not sure as to what might be causing this.  [place]   ROS      Objective:  Physical Exam  Neurovascular status intact muscle strength adequate range of motion within normal limits with patient found to have mild red discoloration of the right second toe distal but I did not note any necrosis or indications of frostbite.  Patient had no pain when I pressed the digit it appears to be soft tissue and had no muscle strength loss or reflex loss     Assessment:  Probability that this may have been cold exposure with low-grade Raynaud's phenomena versus the possibility there could be a compression in the back or localized inflammatory condition     Plan:  H&P reviewed all conditions and x-ray and at this point we are just can put this on a watch for the next several months.  If symptoms were to occur or problems were to occur patient is to let us know right away  X-rays indicate that the bone structure appears normal with no indications of pathology and I did inform her today about Raynaud's and protection against "

## 2019-09-04 ENCOUNTER — Telehealth: Payer: Self-pay | Admitting: Podiatry

## 2019-09-04 NOTE — Telephone Encounter (Signed)
My guarantor number is [id number] and I have a question about a bill I received. I've got secondary insurance and I'm not sure its been filled. I wanted to make sure before I paid the bill. Thank you.

## 2020-01-04 ENCOUNTER — Other Ambulatory Visit: Payer: Self-pay | Admitting: Orthopedic Surgery

## 2020-01-04 DIAGNOSIS — Z981 Arthrodesis status: Secondary | ICD-10-CM

## 2020-01-15 ENCOUNTER — Other Ambulatory Visit: Payer: Self-pay

## 2020-01-15 ENCOUNTER — Ambulatory Visit
Admission: RE | Admit: 2020-01-15 | Discharge: 2020-01-15 | Disposition: A | Discharge status: Home / Self Care | Payer: BLUE CROSS/BLUE SHIELD | Source: Ambulatory Visit | Attending: Orthopedic Surgery | Admitting: Orthopedic Surgery

## 2020-01-15 DIAGNOSIS — Z981 Arthrodesis status: Secondary | ICD-10-CM

## 2020-01-15 MED ORDER — IOPAMIDOL (ISOVUE-M 200) INJECTION 41%
1.0000 mL | Freq: Once | INTRAMUSCULAR | Status: AC
  Administered 2020-01-15: 1 mL

## 2020-01-15 MED ORDER — SODIUM CHLORIDE 0.9 % IV SOLN: INTRAVENOUS | Status: DC

## 2020-01-15 MED ORDER — CEFAZOLIN SODIUM-DEXTROSE 2-4 GM/100ML-% IV SOLN
2.0000 g | INTRAVENOUS | Status: AC
  Administered 2020-01-15: 2 g via INTRAVENOUS

## 2020-01-15 NOTE — Discharge Instructions (Signed)

## 2020-02-26 ENCOUNTER — Encounter

## 2020-03-16 ENCOUNTER — Ambulatory Visit: Payer: PRIVATE HEALTH INSURANCE | Primary: Family

## 2020-05-25 ENCOUNTER — Ambulatory Visit: Attending: Orthopaedic Surgery | Primary: Family

## 2020-05-25 ENCOUNTER — Ambulatory Visit
Admit: 2020-05-25 | Discharge: 2020-05-25 | Payer: BLUE CROSS/BLUE SHIELD | Attending: Orthopaedic Surgery | Primary: Family

## 2020-05-25 ENCOUNTER — Encounter: Admit: 2020-05-25 | Discharge: 2020-05-25 | Payer: BLUE CROSS/BLUE SHIELD | Primary: Family

## 2020-05-25 DIAGNOSIS — M25571 Pain in right ankle and joints of right foot: Secondary | ICD-10-CM

## 2020-05-25 MED ORDER — PREDNISONE 5 MG TABLETS IN A DOSE PACK
5 mg | ORAL_TABLET | ORAL | 2 refills | Status: AC
Start: 2020-05-25 — End: ?

## 2020-05-25 NOTE — Progress Notes (Signed)
The patient was prescribed a walker boot for the patient's right foot. The patient wears a size 7.5 shoe and I fitted them with a S size boot. The patient was fitted and instructed on the use of prescribed walker boot. I explained how to fit themselves and that the plastic flexible piece should always be on the front of the boot and secured by the Velcro straps on top. The air bladder in the boot was adjusted according to proper fit and comfort. The patient walked a short distance and acknowledged satisfaction with current fit. I also explained that they need a heel lift or a higher heeled shoe for the uninvolved LE to help normalize gait and avoid excessive low back stress/strain due to leg length inequality created from walker boot.Patient read and signed documenting they understand and agree to [facility] current DME return policy.

## 2020-05-25 NOTE — Progress Notes (Signed)
Progress Notes by Marcellina Millin, MD at 05/25/20 1200                Author: Marcellina Millin, MD  Service: --  Author Type: Physician       Filed: 05/28/20 1450  Encounter Date: 05/25/2020  Status: Signed          Editor: Marcellina Millin, MD (Physician)                    Name: Nicole [name]   Date of Birth: [birth year]   Gender: female   MRN: [id number]      CC: Right ankle pain      HPI:    05/24/2020: She was getting out of the passenger side of her car and her foot got caught in her purse strap.  She fell out of the car twisting her ankle.   She hurts in the outer aspect of the ankle/hindfoot with sharp shooting pain with walking      ROS/Meds/PSH/PMH/FH/SH: reviewed today     Tobacco:  reports that she has never smoked. She has never used smokeless tobacco.    Prior lumbar fusion with pending fusion      Physical Examination:   Patient appears to be alert and oriented with acceptable appearance.   No obvious distress or SOB   CV: appears to have acceptable vascular color and capillary refill   Neuro: appears to have mostly intact light touch sensation    Skin: Lateral ankle to hindfoot soft tissue swelling   MS: Standing: Plantigrade: Gait protected   Right = anterolateral ankle pain; no Deltoid pain   Right = hindfoot/anterior calcaneal process area pain; syndesmosis anterior calcaneal process   Right = no loss of motor; no gross instability or crepitance      XR: Right side: Standing AP lateral mortise ankle plus AP oblique foot taken today with no acute pathology appreciated; moderate tarsometatarsal arthritis; possible anterior calcaneal process fracture   XR Impression:  As above        Assessment:     Right lateral ankle sprain   Right lateral hindfoot sprain with suspected anterior calcaneal process fracture      Plan:    The patient and I discussed the above assessment. We explored treatment options.       She suffered a lateral ankle/hindfoot injury and I suspect she also has  an anterior calcaneal process fracture.   If no improvement on return, consider MRI scan   Advanced medical imaging: No indication today for ankle MRI scan      DME: Placed in 3D walker boot and understands the importance of a shoe high enough on the left side to the level her pelvis to prevent back strain     We discussed ankle care and protection   PT: Too early for pain at this time      Medication - OTC meds prn; Prescribed:   Boswellia, Devil's claw, Turmeric-curcumin    Magne sports topical rub with frankincense and myrrh    Prednisone 5 mg pack; # 48: take per package insert; 2 refills         Surgical discussion: No indication today for surgery   Follow up: 4 weeks: X-rays ankle and foot   Work status: Regular      This note was created using Conservation officer, historic buildings which may result in errors of speech and spelling recognition and word/phrase syntax errors.

## 2020-05-30 ENCOUNTER — Encounter

## 2020-06-02 ENCOUNTER — Ambulatory Visit: Payer: PRIVATE HEALTH INSURANCE | Primary: Family

## 2020-06-28 ENCOUNTER — Encounter: Attending: Orthopaedic Surgery | Primary: Family

## 2020-06-28 NOTE — Telephone Encounter (Signed)
Called pt as she did not show for her apt today and rescheduled her for next week.

## 2020-07-05 ENCOUNTER — Ambulatory Visit: Attending: Orthopaedic Surgery | Primary: Family

## 2020-07-05 ENCOUNTER — Encounter: Admit: 2020-07-05 | Discharge: 2020-07-05 | Payer: BLUE CROSS/BLUE SHIELD | Primary: Family

## 2020-07-05 ENCOUNTER — Ambulatory Visit
Admit: 2020-07-05 | Discharge: 2020-07-05 | Payer: BLUE CROSS/BLUE SHIELD | Attending: Orthopaedic Surgery | Primary: Family

## 2020-07-05 DIAGNOSIS — M25571 Pain in right ankle and joints of right foot: Secondary | ICD-10-CM

## 2020-07-05 MED ORDER — LIDOCAINE 5 % TOPICAL CREAM
5 % | CUTANEOUS | 2 refills | Status: AC
Start: 2020-07-05 — End: ?

## 2020-07-05 NOTE — Progress Notes (Signed)
Patient was fitted and instructed on a Reparel Ankle Sleeve for the right foot.  The patient was prescribed a Wraptor brace for the patient's right foot. The patient wears a size 7.5 shoe and I fitted the patient with a S brace. I explained how to fit the brace properly by pulling the lace tabs across top of foot first then under arch and lastly pulling the strap up firmly and attaching to the lateral Velcro strip. Thus forming a figure 8 across the ankle joint. Once the figure 8 is completed they are to secure the top (short circumferential) straps to help avoid the straps from loosening with normal wear.  The patient was able to demonstrate proper fitting in office to ensure compliance with device and acknowledged satisfaction with current fit. Patient read and signed documenting they understand and agree to [facility] current DME return policy.

## 2020-07-05 NOTE — Progress Notes (Signed)
Progress Notes by Marcellina Millin, MD at 07/05/20 [phone]                Author: Marcellina Millin, MD  Service: --  Author Type: Physician       Filed: 07/06/20 2038  Encounter Date: 07/05/2020  Status: Signed          Editor: Marcellina Millin, MD (Physician)                    Name: Doristine Counter [place]   Date of Birth: 10/08/1965   Gender: female   MRN: [id number]      She was last seen 05/25/2021   She feels much better       HPI:    05/24/2020: She was getting out of the passenger side of her car and her foot got caught in her purse strap.  She fell out of the car twisting her ankle.   She hurts in the outer aspect of the ankle/hindfoot with sharp shooting pain with walking      ROS/Meds/PSH/PMH/FH/SH: reviewed today     Tobacco:  reports that she has never smoked. She has never used smokeless tobacco.    Prior lumbar fusion with pending fusion      Physical Examination:   Patient appears to be alert and oriented with acceptable appearance.   No obvious distress or SOB   CV: appears to have acceptable vascular color and capillary refill   Neuro: appears to have mostly intact light touch sensation    Skin: very minimal lateral ankle to hindfoot soft tissue swelling   MS: Standing: Plantigrade: Gait is full   Right = mild anterolateral ankle pain to lateral ACP area pain;   Right = no Deltoid pain; no syndesmosis pain    Right = full ankle/hindfoot motion; 5/5 strength; no instability or crepitance      XR: Right side: Standing AP lateral mortise ankle plus AP oblique foot taken today with no acute fracture appreciated; moderate tarsometatarsal arthritis   XR Impression:  As above        Assessment:     Right lateral ankle/hindfoot sprain with possible occult anterior calcaneal process fracture versus bifurcate ligament injury/tear      Plan:    The patient and I discussed the above assessment. We explored treatment options.       I still feel her major issue is the hindfoot.   X-rays revealed no  definite fracture so more likely bifurcate ligament injury   Advanced medical imaging: We discussed MRI scan but at this point, she is doing well so hold: Right ankle/hindfoot MRI scan      DME: Placed in Reparel sleeve and lace up ankle brace to allow her to wean her 3D walker boot   We discussed ankle care and ankle protection   PT: She seems to be doing well enough to where physical therapy is not indicated today but certainly an option in the future      Medication - OTC meds prn;    Prescribed: Topical 5% Neurontin, 5% Xylocaine    She can continue as needed the prior prescribed:   Boswellia, Devil's claw, Turmeric-curcumin    Magne sports topical rub with frankincense and myrrh    She completed the prior prescribed: Prednisone 5 mg pack; # 48: take per package insert; 2 refills         Surgical discussion: No indication today for surgery   Follow up: 4  weeks: As needed   Work status: Regular      This note was created using Conservation officer, historic buildings which may result in errors of speech and spelling recognition and word/phrase syntax errors.

## 2021-07-26 ENCOUNTER — Encounter

## 2021-08-03 ENCOUNTER — Inpatient Hospital Stay: Admit: 2021-08-03 | Payer: BLUE CROSS/BLUE SHIELD | Primary: Family

## 2021-08-03 DIAGNOSIS — M5459 Other low back pain: Secondary | ICD-10-CM

## 2021-09-19 ENCOUNTER — Ambulatory Visit: Payer: Self-pay | Admitting: Orthopedic Surgery

## 2021-11-09 NOTE — Progress Notes (Signed)
Surgical Instructions    Your procedure is scheduled on Thursday, June 15th.  Report to [facility] Main Entrance "A" at 5:30 A.M., then check in with the Admitting office.  Call this number if you have problems the morning of surgery:  [phone]   If you have any questions prior to your surgery date call [phone]: Open Monday-Friday 8am-4pm    Remember:  Do not eat or drink after midnight the night before your surgery     Take these medicines the morning of surgery with A SIP OF WATER:   Omeprazole (Prilosec)  Paroxetine (Paxil)  Pravastatin (Pravachol)   If needed:  Zyrtec  Flonase Nasal Spray   As of today, STOP taking any Aspirin (unless otherwise instructed by your surgeon) Aleve, Naproxen, Ibuprofen, Motrin, Advil, Goody's, BC's, all herbal medications, fish oil, and all vitamins.   HOW TO MANAGE YOUR DIABETES BEFORE AND AFTER SURGERY  Why is it important to control my blood sugar before and after surgery? Improving blood sugar levels before and after surgery helps healing and can limit problems. A way of improving blood sugar control is eating a healthy diet by:  Eating less sugar and carbohydrates  Increasing activity/exercise  Talking with your doctor about reaching your blood sugar goals High blood sugars (greater than 180 mg/dL) can raise your risk of infections and slow your recovery, so you will need to focus on controlling your diabetes during the weeks before surgery. Make sure that the doctor who takes care of your diabetes knows about your planned surgery including the date and location.  How do I manage my blood sugar before surgery? Check your blood sugar at least 4 times a day, starting 2 days before surgery, to make sure that the level is not too high or low.  Check your blood sugar the morning of your surgery when you wake up and every 2 hours until you get to the Short Stay unit.  If your blood sugar is less than 70 mg/dL, you will need to  treat for low blood sugar: Do not take insulin. Treat a low blood sugar (less than 70 mg/dL) with  cup of clear juice (cranberry or apple), 4 glucose tablets, OR glucose gel. Recheck blood sugar in 15 minutes after treatment (to make sure it is greater than 70 mg/dL). If your blood sugar is not greater than 70 mg/dL on recheck, call [phone] for further instructions. Report your blood sugar to the short stay nurse when you get to Short Stay.  If you are admitted to the hospital after surgery: Your blood sugar will be checked by the staff and you will probably be given insulin after surgery (instead of oral diabetes medicines) to make sure you have good blood sugar levels. The goal for blood sugar control after surgery is 80-180 mg/dL.            DAY OF SURGERY: Do not wear jewelry or makeup Do not wear lotions, powders, perfumes, or deodorant. Do not shave 48 hours prior to surgery.   Do not bring valuables to the hospital. Do not wear nail polish, gel polish, artificial nails, or any other type of covering on natural nails (fingers and toes) If you have artificial nails or gel coating that need to be removed by a nail salon, please have this removed prior to surgery. Artificial nails or gel coating may interfere with anesthesia's ability to adequately monitor your vital signs.  [facility] is not responsible for any belongings or valuables. Marland Kitchen  Do NOT Smoke (Tobacco/Vaping)  24 hours prior to your procedure  If you use a CPAP at night, you may bring your mask for your overnight stay.   Contacts, glasses, hearing aids, dentures or partials may not be worn into surgery, please bring cases for these belongings   For patients admitted to the hospital, discharge time will be determined by your treatment team.   Patients discharged the day of surgery will not be allowed to drive home, and someone needs to stay with them for 24 hours.   SURGICAL WAITING ROOM VISITATION Patients having  surgery or a procedure in a hospital may have two support people. Children under the age of 99 must have an adult with them who is not the patient. They may stay in the waiting area during the procedure and may switch out with other visitors. If the patient needs to stay at the hospital during part of their recovery, the visitor guidelines for inpatient rooms apply.  Please refer to the [facility] website for the visitor guidelines for Inpatients (after your surgery is over and you are in a regular room).    Special instructions:    Oral Hygiene is also important to reduce your risk of infection.  Remember - BRUSH YOUR TEETH THE MORNING OF SURGERY WITH YOUR REGULAR TOOTHPASTE   [facility]- Preparing For Surgery  Before surgery, you can play an important role. Because skin is not sterile, your skin needs to be as free of germs as possible. You can reduce the number of germs on your skin by washing with CHG (chlorahexidine gluconate) Soap before surgery.  CHG is an antiseptic cleaner which kills germs and bonds with the skin to continue killing germs even after washing.     Please do not use if you have an allergy to CHG or antibacterial soaps. If your skin becomes reddened/irritated stop using the CHG.  Do not shave (including legs and underarms) for at least 48 hours prior to first CHG shower. It is OK to shave your face.  Please follow these instructions carefully.     Shower the NIGHT BEFORE SURGERY and the MORNING OF SURGERY with CHG Soap.   If you chose to wash your hair, wash your hair first as usual with your normal shampoo. After you shampoo, rinse your hair and body thoroughly to remove the shampoo.  Then [employer] and genitals (private parts) with your normal soap and rinse thoroughly to remove soap.  After that Use CHG Soap as you would any other liquid soap. You can apply CHG directly to the skin and wash gently with a scrungie or a clean washcloth.   Apply the CHG Soap to  your body ONLY FROM THE NECK DOWN.  Do not use on open wounds or open sores. Avoid contact with your eyes, ears, mouth and genitals (private parts). Wash Face and genitals (private parts)  with your normal soap.   Wash thoroughly, paying special attention to the area where your surgery will be performed.  Thoroughly rinse your body with warm water from the neck down.  DO NOT shower/wash with your normal soap after using and rinsing off the CHG Soap.  Pat yourself dry with a CLEAN TOWEL.  Wear CLEAN PAJAMAS to bed the night before surgery  Place CLEAN SHEETS on your bed the night before your surgery  DO NOT SLEEP WITH PETS.   Day of Surgery:  Take a shower with CHG soap. Wear Clean/Comfortable clothing the morning of surgery Do  not apply any deodorants/lotions.   Remember to brush your teeth WITH YOUR REGULAR TOOTHPASTE.  Please read over the following fact sheets that you were given.

## 2021-11-10 ENCOUNTER — Encounter (HOSPITAL_COMMUNITY): Payer: Self-pay | Admitting: *Deleted

## 2021-11-10 ENCOUNTER — Other Ambulatory Visit: Payer: Self-pay

## 2021-11-10 ENCOUNTER — Encounter (HOSPITAL_COMMUNITY)
Admission: RE | Admit: 2021-11-10 | Discharge: 2021-11-10 | Disposition: A | Discharge status: Home / Self Care | Payer: BLUE CROSS/BLUE SHIELD | Source: Ambulatory Visit | Attending: Orthopedic Surgery | Admitting: Orthopedic Surgery

## 2021-11-10 VITALS — BP 137/61 | HR 66 | Temp 98.2°F | Resp 17 | Ht 64.0 in | Wt 177.0 lb

## 2021-11-10 DIAGNOSIS — Z01818 Encounter for other preprocedural examination: Secondary | ICD-10-CM

## 2021-11-10 DIAGNOSIS — Z01812 Encounter for preprocedural laboratory examination: Secondary | ICD-10-CM | POA: Insufficient documentation

## 2021-11-10 LAB — TYPE AND SCREEN
ABO/RH(D): O POS
Antibody Screen: NEGATIVE

## 2021-11-10 LAB — CBC
HCT: 40.5 % (ref 36.0–46.0)
Hemoglobin: 13.7 g/dL (ref 12.0–15.0)
MCH: 30.6 pg (ref 26.0–34.0)
MCHC: 33.8 g/dL (ref 30.0–36.0)
MCV: 90.4 fL (ref 80.0–100.0)
Platelets: 233 10*3/uL (ref 150–400)
RBC: 4.48 MIL/uL (ref 3.87–5.11)
RDW: 13 % (ref 11.5–15.5)
WBC: 6.1 10*3/uL (ref 4.0–10.5)
nRBC: 0 % (ref 0.0–0.2)

## 2021-11-10 LAB — SURGICAL PCR SCREEN
MRSA, PCR: NEGATIVE
Staphylococcus aureus: NEGATIVE

## 2021-11-10 NOTE — Progress Notes (Signed)
Formatting of this note might be different from the original.  PCP - Shelah Lewandowsky, NP  Cardiologist - denies    PPM/ICD - denies    Chest x-ray - 01/04/18  EKG - 09/06/21- records requested  Stress Test - 02/04/18  ECHO - 02/04/18  Cardiac Cath - denies    Sleep Study - 10+ yrs ago per pt, OSA-    DM- denies    ASA/Blood Thinner Instructions: n/a    ERAS Protcol - no, NPO    COVID TEST- n/a    Anesthesia review: no    Patient denies shortness of breath, fever, cough and chest pain at PAT appointment    All instructions explained to the patient, with a verbal understanding of the material. Patient agrees to go over the instructions while at home for a better understanding. Patient also instructed to notify surgeon of any contact with COVID+ person or if she develops any symptoms. The opportunity to ask questions was provided.    Electronically signed by Myrtis Ser, RN at 11/10/2021  8:51 AM EDT

## 2021-11-10 NOTE — Progress Notes (Signed)
Formatting of this note might be different from the original.  Surgical Instructions     Your procedure is scheduled on Thursday, June 15th.   Report to [facility] Main Entrance "A" at 5:30 A.M., then check in with the Admitting office.   Call this number if you have problems the morning of surgery:   [phone]     If you have any questions prior to your surgery date call [phone]: Open Monday-Friday 8am-4pm     Remember:   Do not eat or drink after midnight the night before your surgery       Take these medicines the morning of surgery with A SIP OF WATER:    Omeprazole (Prilosec)   Paroxetine (Paxil)   Pravastatin (Pravachol)     If needed:   Zyrtec   Flonase Nasal Spray    As of today, STOP taking any Aspirin (unless otherwise instructed by your surgeon) Aleve, Naproxen, Ibuprofen, Motrin, Advil, Goody's, BC's, all herbal medications, fish oil, and all vitamins.    HOW TO MANAGE YOUR DIABETES  BEFORE AND AFTER SURGERY    Why is it important to control my blood sugar before and after surgery?  Improving blood sugar levels before and after surgery helps healing and can limit problems.  A way of improving blood sugar control is eating a healthy diet by:   Eating less sugar and carbohydrates   Increasing activity/exercise   Talking with your doctor about reaching your blood sugar goals  High blood sugars (greater than 180 mg/dL) can raise your risk of infections and slow your recovery, so you will need to focus on controlling your diabetes during the weeks before surgery.  Make sure that the doctor who takes care of your diabetes knows about your planned surgery including the date and location.    How do I manage my blood sugar before surgery?  Check your blood sugar at least 4 times a day, starting 2 days before surgery, to make sure that the level is not too high or low.    Check your blood sugar the morning of your surgery when you wake up and every 2 hours until you get to the Short Stay unit.    If your  blood sugar is less than 70 mg/dL, you will need to treat for low blood sugar:  Do not take insulin.  Treat a low blood sugar (less than 70 mg/dL) with  cup of clear juice (cranberry or apple), 4 glucose tablets, OR glucose gel.  Recheck blood sugar in 15 minutes after treatment (to make sure it is greater than 70 mg/dL). If your blood sugar is not greater than 70 mg/dL on recheck, call [phone] for further instructions.  Report your blood sugar to the short stay nurse when you get to Short Stay.    If you are admitted to the hospital after surgery:  Your blood sugar will be checked by the staff and you will probably be given insulin after surgery (instead of oral diabetes medicines) to make sure you have good blood sugar levels.  The goal for blood sugar control after surgery is 80-180 mg/dL.             DAY OF SURGERY:  Do not wear jewelry or makeup  Do not wear lotions, powders, perfumes, or deodorant.  Do not shave 48 hours prior to surgery.    Do not bring valuables to the hospital.  Do not wear nail polish, gel polish, artificial nails, or any  other type of covering on natural nails (fingers and toes)  If you have artificial nails or gel coating that need to be removed by a nail salon, please have this removed prior to surgery. Artificial nails or gel coating may interfere with anesthesia's ability to adequately monitor your vital signs.    [facility] is not responsible for any belongings or valuables. .     Do NOT Smoke (Tobacco/Vaping)  24 hours prior to your procedure    If you use a CPAP at night, you may bring your mask for your overnight stay.    Contacts, glasses, hearing aids, dentures or partials may not be worn into surgery, please bring cases for these belongings    For patients admitted to the hospital, discharge time will be determined by your treatment team.    Patients discharged the day of surgery will not be allowed to drive home, and someone needs to stay with them for 24  hours.    SURGICAL WAITING ROOM VISITATION  Patients having surgery or a procedure in a hospital may have two support people.  Children under the age of 65 must have an adult with them who is not the patient.  They may stay in the waiting area during the procedure and may switch out with other visitors. If the patient needs to stay at the hospital during part of their recovery, the visitor guidelines for inpatient rooms apply.    Please refer to the [facility] website for the visitor guidelines for Inpatients (after your surgery is over and you are in a regular room).     Special instructions:      Oral Hygiene is also important to reduce your risk of infection.  Remember - BRUSH YOUR TEETH THE MORNING OF SURGERY WITH YOUR REGULAR TOOTHPASTE    [facility]- Preparing For Surgery    Before surgery, you can play an important role. Because skin is not sterile, your skin needs to be as free of germs as possible. You can reduce the number of germs on your skin by washing with CHG (chlorahexidine gluconate) Soap before surgery.  CHG is an antiseptic cleaner which kills germs and bonds with the skin to continue killing germs even after washing.      Please do not use if you have an allergy to CHG or antibacterial soaps. If your skin becomes reddened/irritated stop using the CHG.   Do not shave (including legs and underarms) for at least 48 hours prior to first CHG shower. It is OK to shave your face.    Please follow these instructions carefully.       Shower the NIGHT BEFORE SURGERY and the MORNING OF SURGERY with CHG Soap.    If you chose to wash your hair, wash your hair first as usual with your normal shampoo. After you shampoo, rinse your hair and body thoroughly to remove the shampoo.  Then [employer] and genitals (private parts) with your normal soap and rinse thoroughly to remove soap.    After that Use CHG Soap as you would any other liquid soap. You can apply CHG directly to the skin and wash gently with a  scrungie or a clean washcloth.     Apply the CHG Soap to your body ONLY FROM THE NECK DOWN.  Do not use on open wounds or open sores. Avoid contact with your eyes, ears, mouth and genitals (private parts). Wash Face and genitals (private parts)  with your normal soap.  Wash thoroughly, paying special attention to the area where your surgery will be performed.    Thoroughly rinse your body with warm water from the neck down.    DO NOT shower/wash with your normal soap after using and rinsing off the CHG Soap.    Pat yourself dry with a CLEAN TOWEL.    Wear CLEAN PAJAMAS to bed the night before surgery    Place CLEAN SHEETS on your bed the night before your surgery    DO NOT SLEEP WITH PETS.    Day of Surgery:    Take a shower with CHG soap.  Wear Clean/Comfortable clothing the morning of surgery  Do not apply any deodorants/lotions.    Remember to brush your teeth WITH YOUR REGULAR TOOTHPASTE.    Please read over the following fact sheets that you were given.     Electronically signed by Derry SkillBurdo, Emily J, RN at 11/09/2021  8:16 AM EDT

## 2021-11-10 NOTE — Progress Notes (Signed)
PCP - Judithe Modest, NP Cardiologist - denies  PPM/ICD - denies   Chest x-ray - 01/04/18 EKG - 09/06/21- records requested Stress Test - 02/04/18 ECHO - 02/04/18 Cardiac Cath - denies  Sleep Study - 10+ yrs ago per pt, OSA-   DM- denies  ASA/Blood Thinner Instructions: n/a   ERAS Protcol - no, NPO   COVID TEST- n/a   Anesthesia review: no  Patient denies shortness of breath, fever, cough and chest pain at PAT appointment   All instructions explained to the patient, with a verbal understanding of the material. Patient agrees to go over the instructions while at home for a better understanding. Patient also instructed to notify surgeon of any contact with COVID+ person or if she develops any symptoms. The opportunity to ask questions was provided.

## 2021-11-13 ENCOUNTER — Other Ambulatory Visit (HOSPITAL_COMMUNITY): Payer: BLUE CROSS/BLUE SHIELD

## 2021-11-23 ENCOUNTER — Encounter (HOSPITAL_COMMUNITY): Admission: RE | Payer: Self-pay | Source: Ambulatory Visit

## 2021-11-23 ENCOUNTER — Inpatient Hospital Stay (HOSPITAL_COMMUNITY)
Admission: RE | Admit: 2021-11-23 | Payer: BLUE CROSS/BLUE SHIELD | Source: Ambulatory Visit | Admitting: Orthopedic Surgery

## 2021-11-23 SURGERY — ANTERIOR LATERAL LUMBAR FUSION WITH PERCUTANEOUS SCREW 1 LEVEL
Anesthesia: General

## 2022-03-12 NOTE — Telephone Encounter (Signed)
Patient wants to be seen for spine issue.  Previously seeing a doctor in [place], [place].  Told patient to bring in office visit notes and imaging discs for review and drop off at one POA offices and tell the person up front to forward to me.  I will send them to the doctor for review, and once they review and give back to me, I will give them a call about scheduling.  Patient stated understanding and will bring in records.  Waiting on patient to drop off records for review with the spine team.

## 2022-04-02 ENCOUNTER — Ambulatory Visit: Admit: 2022-04-02 | Discharge: 2022-05-11 | Payer: BLUE CROSS/BLUE SHIELD | Primary: Family

## 2022-04-02 ENCOUNTER — Ambulatory Visit: Admit: 2022-04-02 | Discharge: 2022-04-02 | Payer: BLUE CROSS/BLUE SHIELD | Attending: Surgical | Primary: Family

## 2022-04-02 ENCOUNTER — Ambulatory Visit: Payer: BLUE CROSS/BLUE SHIELD | Primary: Family

## 2022-04-02 DIAGNOSIS — M545 Low back pain, unspecified: Secondary | ICD-10-CM

## 2022-04-02 NOTE — Progress Notes (Signed)
Name: Nicole [name]  Date of Birth: Jan 25, 1966  Gender: female  MRN: [id number]    CC: Back Pain (Low to mid back pain )       HPI: This is a 56 y.o. year old female who has history of lumbar fusion done at [facility] in 2004 what appears to be degenerative changes and a spondylolisthesis L5-S1 and the fusion is from L3-S1.  She did very well for many years after the fusion.  She began having some discomfort in her lower back again in 2018.  It sounds like she was managed maybe with some lumbar injections for several years before COVID and then in January 2023 she had increasing back pain for the pop in the back and has really been struggling with her pain since that time.  Mostly across her back.  She can get some discomfort radiating into the buttocks.  She does not feel like the pain is deep in the left leg.  Before her surgery was more right leg pain.  She was managed by Dr. Shon Baton [facility]  She reports they were discussing extending her fusion with what sounds like a DLIF at L2-3.  Prior to this decision, discogram was done in which there were concordant findings at the L2-3 level.  Patient reports the surgery was denied by insurance and when she more recently followed up with the surgeon they did not want to pursue the surgical intervention.     She reports she has had injections in the past but I do not have documentation of any previous injections and she really does not want to pursue more injections.  She is interested in surgical consultation.           04/02/2022     3:43 PM   AMB PAIN ASSESSMENT   Location of Pain Back   Severity of Pain 6   Quality of Pain Aching;Other (Comment)    Duration of Pain Persistent   Frequency of Pain Constant   Date Pain First Started 11/20/2016   Limiting Behavior Some   Relieving Factors Other (Comment)    Result of Injury No   Work-Related Injury No   Are there other pain locations you wish to document? No       Significant value               ROS/Meds/PSH/PMH/FH/SH: I personally reviewed the patient's collected intake data.  Below are the pertinents:    Allergies   Allergen Reactions    Niacin And Related Itching, Rash and Other (See Comments)     severe  Itch  severe  severe  severe  severe  severe  Itch  severe  severe      Atorvastatin Rash    Codeine Nausea And Vomiting, Nausea Only and Other (See Comments)     Other reaction(s): Unknown      Hydromorphone Nausea And Vomiting, Nausea Only and Other (See Comments)     nausea  Other reaction(s): Unknown  nausea      Hydromorphone Hcl Nausea And Vomiting    Niacin Itching     severe         Current Outpatient Medications:     cetirizine (ZYRTEC) 10 MG tablet, Take 1 tablet by mouth daily, Disp: , Rfl:     co-enzyme Q-10 30 MG capsule, Take 1 capsule by mouth, Disp: , Rfl:     fenofibrate (TRICOR) 54 MG tablet, TAKE 1 TABLET BY MOUTH EVERY DAY  WITH A MEAL FOR 90 DAYS, Disp: , Rfl:     fluticasone (FLONASE ALLERGY RELIEF) 50 MCG/ACT nasal spray, 2 sprays in each nostril Nasally Once a day, Disp: , Rfl:     ibuprofen (ADVIL;MOTRIN) 800 MG tablet, Take 1 tablet by mouth every 8 hours as needed, Disp: , Rfl:     meloxicam (MOBIC) 15 MG tablet, Take 1 tablet by mouth daily, Disp: , Rfl:     Melatonin 10 MG TABS, Take 20 mg by mouth nightly as needed, Disp: , Rfl:     PARoxetine (PAXIL) 10 MG tablet, TAKE 1 TABLET BY MOUTH EVERY DAY IN THE MORNING FOR 30 DAYS, Disp: , Rfl:     scopolamine (TRANSDERM-SCOP) transdermal patch, 1 PATCH TO SKIN AS NEEDED TRANSDERMAL EVERY 72 HOURS 15 DAYS, Disp: , Rfl:     pravastatin (PRAVACHOL) 80 MG tablet, Take 1 tablet by mouth, Disp: , Rfl:     Cinnamon 500 MG CAPS, Take by mouth daily, Disp: , Rfl:     Diclofenac Potassium,Migraine, 50 MG PACK, Take by mouth, Disp: , Rfl:     magnesium oxide (MAG-OX) 400 (240 Mg) MG tablet, Take 1 tablet by mouth 2 times daily, Disp: , Rfl:     metoclopramide (REGLAN) 10 MG tablet, Take 1 tablet by mouth daily as needed, Disp: , Rfl:      omeprazole (PRILOSEC) 20 MG delayed release capsule, Take 1 capsule by mouth daily, Disp: , Rfl:     amoxicillin-clavulanate (AUGMENTIN) 875-125 MG per tablet, amoxicillin 875 mg-potassium clavulanate 125 mg tablet  TAKE 1 TABLET BY MOUTH EVERY 12 HOURS FOR 10 DAYS (Patient not taking: Reported on 04/02/2022), Disp: , Rfl:     baclofen (LIORESAL) 10 MG tablet, Take 1 tablet by mouth daily as needed (Patient not taking: Reported on 04/02/2022), Disp: , Rfl:     vitamin B-12 (CYANOCOBALAMIN) 1000 MCG tablet, Take 2.5 tablets by mouth daily (Patient not taking: Reported on 04/02/2022), Disp: , Rfl:     diphenhydrAMINE (SOMINEX) 25 MG tablet, Take 1 tablet by mouth nightly as needed (Patient not taking: Reported on 04/02/2022), Disp: , Rfl:     diphenhydrAMINE-APAP, sleep, (TYLENOL PM EXTRA STRENGTH) 25-500 MG tablet, Take 1 tablet by mouth nightly as needed (Patient not taking: Reported on 04/02/2022), Disp: , Rfl:     fluticasone (VERAMYST) 27.5 MCG/SPRAY nasal spray, by Nasal route (Patient not taking: Reported on 04/02/2022), Disp: , Rfl:     Fluticasone Propionate, Inhal, 50 MCG/ACT AEPB, 1 mcg by Nasal route as needed (Patient not taking: Reported on 04/02/2022), Disp: , Rfl:     Galcanezumab-gnlm (EMGALITY) 120 MG/ML SOAJ, Inject 1 mL into the skin every 30 days (Patient not taking: Reported on 04/02/2022), Disp: , Rfl:     hydrocortisone (WESTCORT) 0.2 % cream, APPLY A THIN LAYER TO AFFETED AREA ON THE FACE TOPICALLY TWICE DAILY (Patient not taking: Reported on 04/02/2022), Disp: , Rfl:     imipramine (TOFRANIL) 50 MG tablet, Take 1 tablet by mouth (Patient not taking: Reported on 04/02/2022), Disp: , Rfl:     levocetirizine (XYZAL) 5 MG tablet, Take 1 tablet by mouth daily (Patient not taking: Reported on 04/02/2022), Disp: , Rfl:     levonorgestrel-ethinyl estradiol (NORDETTE) 0.15-30 MG-MCG per tablet, Take 1 tablet by mouth daily (Patient not taking: Reported on 04/02/2022), Disp: , Rfl:     Multiple  Vitamins-Minerals (MULTIVITAMIN-MINERALS) TABS tablet, Take by mouth (Patient not taking: Reported on 04/02/2022), Disp: , Rfl:  rizatriptan (MAXALT) 10 MG tablet, Take 1 tablet by mouth as needed (Patient not taking: Reported on 04/02/2022), Disp: , Rfl:     Lidocaine, Anorectal, 5 % CREA, Actual prescription: Lidocaine 5%: Neurontin/gabapentin 5% combined topical cream/gelApply to affected area up to 4 times a day as needed for pain (Patient not taking: Reported on 04/02/2022), Disp: , Rfl:     naltrexone-buPROPion (CONTRAVE) 8-90 MG per extended release tablet, Take 1 tablet by mouth 2 times daily (Patient not taking: Reported on 04/02/2022), Disp: , Rfl:     PARoxetine Mesylate 7.5 MG CAPS, Take by mouth (Patient not taking: Reported on 04/02/2022), Disp: , Rfl:     pravastatin (PRAVACHOL) 40 MG tablet, Take 40 mg by mouth (Patient not taking: Reported on 04/02/2022), Disp: , Rfl:     predniSONE (DELTASONE) 5 MG tablet, Take as directed per package instruction for the 5mg  dose pack Indications: Inflammation (Patient not taking: Reported on 04/02/2022), Disp: , Rfl:     Probiotic Product (ACIDOPHILUS PROBIOTIC) CAPS capsule, Take by mouth daily (Patient not taking: Reported on 04/02/2022), Disp: , Rfl:     Past Surgical History:   Procedure Laterality Date    BREAST BIOPSY Left     benign    HEENT Right     TMJ    HYSTERECTOMY (CERVIX STATUS UNKNOWN)  2015    LUMBAR FUSION  2000    ORTHOPEDIC SURGERY Right 1976    ankle fx with hardware    ORTHOPEDIC SURGERY Right     right plantar fascitis    PR UNLISTED PROCEDURE ABDOMEN PERITONEUM & OMENTUM  2016    tummy tuck       There is no problem list on file for this patient.        Tobacco:  reports that she has never smoked. She has never used smokeless tobacco.  Alcohol:   Social History     Substance and Sexual Activity   Alcohol Use Yes        Physical Exam:   BMI: There is no height or weight on file to calculate BMI.    GENERAL:  Adult in no acute distress,   obese Patient is appropriately conversant  MSK:  Examination of the lumbar spine reveals no sagittal or coronal imbalance   There is mild tenderness to palpation along the spinous processes and paraspinal musculature.   The patient ambulates with a normal gait.    ROM of bilateral hip(s) reveals no irritability.   NEURO:  Cranial nerves grossly intact.  No motor deficits.    Straight leg testing is negative bilateral  Sensory testing reveals intact sensation to light touch and in the distribution of the L3-S1 dermatomes bilaterally  Ankle jerk is negative for clonus    Reflexes   Right Left   Quadriceps (L4) 2 2   Achilles (S1) 2 2     Strength testing in the lower extremity reveals the following based on the 5 point grading scale:     HF (L2) H Ab (L5) KE (L3/4) ADF (L4) EHL (L5) A Ev (S1) APF (S1)   Right 5 5 5 5 5 5 5    Left 5 5 5 5 5 5 5      PSYCH:  Alert and oriented X 3.  Appropriate affect.  Intact judgment and insight.         Radiographic Studies:     Independent interpretation of AP, lateral and spot views of the lumbar spine:  04/02/22  AP of the lumbar spine shows normal alignment.  There is instrumentation intact L3-S1 with pedicle screw instrumentation and rods are in place without evidence of loosening or breaking.  There is solid fusion L3-S1.  There are degenerative changes above the fusion L2-3 and L1-2 there is facet arthropathy.    X-ray impression: Stable L3-S1 fusion with adjacent level degenerative changes.    I also reviewed independently the MRI of the lumbar spine from September 2023 and also February 2023.  Both of these do reveal degenerative disc changes at L1-2 and L2-3.  L1-2 there is some slight disc bulging no central or foraminal stenosis.  L2-3 there is more disc degeneration minimal disc bulging no central stenosis or significant foraminal stenosis.  There is bilateral facet arthropathy at both of these levels.    Report impression is degenerative disc and facet changes at L2-3  but no central stenosis or foraminal narrowing.        Assessment/Plan:       Diagnosis Orders   1. Low back pain, unspecified back pain laterality, unspecified chronicity, unspecified whether sciatica present  XR LUMBAR SPINE (2-3 VIEWS)      2. DDD (degenerative disc disease), lumbar        3. Facet arthropathy, lumbar        4. S/P lumbar spinal arthrodesis              This patient's clinical history and physical exam is consistent with facetogenic back pain.   I recommended to avoid further surgery as much as possible.  She does not have central stenosis or foraminal stenosis.  She does have degenerative disc changes and likely has discogenic back pain but also facet agenic back pain.  Future surgery would likely involve TLIF of L2-3 and this there would need to be posterior instrumentation as well and the risk of revision surgery along with further decompensation of adjacent level problems I do not think outweighs the benefit the result as she does not have significant stenosis.  I would be concerned about more stressed on the L1-2 disc that has more of a disc protrusion although there is no stenosis at this time that could rupture.  The patient has not had facet injections that I am aware of and I would recommend she could be a candidate for L2-3 and L1 to facet injections and consider potential rhizotomy.  I am going to refer her to pain management for this consideration.  She may need medication for anxiety prior to the procedures if deemed indicated.      - Referral to pain management. The patient's care would be best managed in a formal pain management setting and will be referred accordingly.      4 This is a chronic illness/condition with exacerbation and progression    No orders of the defined types were placed in this encounter.       Orders Placed This Encounter   Procedures    XR LUMBAR SPINE (2-3 VIEWS)              Return for referral to pain management.     Wilmon Pali,  PA-C  04/02/22      Elements of this note were created using speech recognition software.  As such, errors of speech recognition may be present.

## 2022-04-02 NOTE — Patient Instructions (Signed)
Spondylitis (Spinal Arthritis) and Facet Joint Syndrome  At each level in our spine, there is a single disc separating the bones (vertebrae) in front of the spinal canal, and a pair of joints called facet joints joining the bones together behind the spinal canal. As we age, the spinal discs and facet joints can wear out and degenerate. Disc degeneration is the term used to describe the wearing out of the discs. Spondylosis is the term used to describe degeneration and arthritis of the facet joints. Degeneration of the spine is a normal aging process, and in most cases spinal arthritis does not cause significant symptoms. However, for some people, arthritic facet joints can cause significant pain. Back or neck pain resulting from arthritic or inflamed facet joints is a condition called "facet joint syndrome".        Symptoms  Back pain with radiation into hips and buttocks or neck pain with radiation into the shoulders  Natural History ("Doing Nothing")  Not all arthritic facet joints cause symptoms   Back or neck pain may not be coming from the facet joints even if they are arthritic   Symptoms may resolve without treatment   Symptoms may be short-lived and infrequent   Rarely, patients develop more persistent and debilitating pain   Facet joints have very little ability to repair themselves or regenerate  Three Phases of Treatment:   Phase I - Non-Invasive Treatments   Phase II - Spinal Injections   Phase III - Surgery (rare for this condition unless radiculopathy is present)   Goals of Each Phase:   Relieve Pain   Improve Function  Treatment Options: Phase I - Non-Invasive Treatments  Physical Therapy and Regular Home Exercise   Neck or Back Strengthening   Flexibility and Stretching  Oral Medications   Steroids   Non-Steroid Anti-Inflammatories (NSAIDs)   Pain relievers   Muscle Relaxants  Ice and Heat   4-6 weeks of Phase I treatment before MRI and going to Phase II  Treatment Options: Phase II - Facet Joint  Injections and Facet Joint Nerve Ablation (RFNA)  Facet Joint Injections   Outpatient procedure   Done with x-ray guidance   Steroid is injected into the inflamed joint to reduce pain   May relieve symptoms, but will not reverse the joint arthritis   Successful injection may help confirm that pain is coming from the facet joints   Facet Joint Rhizotomy (Nerve Ablation) (Radiofrequency Nerve Ablation - RFNA)  The word rhizotomy means "nerve destruction" or nerve ablation. In facet rhizotomy, the tiny nerve fibers that carry pain signals from the facet joints to the brain are selectively destroyed using some form of energy.   For patients who have had successful, but temporary relief of their back or neck pain from the facet injections, facet rhizotomy may provide more long-term relief.   Facet rhizotomy is most commonly performed using a form of energy called radiofrequency (RF) energy. When done with RF, this technique is often called radiofrequency nerve ablation (RFNA).   Treatment Options: Phase III - Surgery  Rarely needed for Spondylosis or Facet Joint Syndrome unless radiculopathy or stenosis is present   Back and neck pain from Spondylosis can be treated non-surgically in most cases    [facility]    Christopher VanPelt, MD  Emmett Lucas, MD  Anja Neuzil, PA-C  Jaime Elliotte, NP    Main Office  [address]  [place], [address]  [address] Office  [address] [place] [address]       For Appointments at either location, please call [phone]

## 2022-06-19 ENCOUNTER — Encounter

## 2022-06-20 NOTE — Telephone Encounter (Signed)
A referral to pain management has been ordered. [facility].
